# Patient Record
Sex: Female | Born: 1965
Health system: Southern US, Community
[De-identification: ages and names within clinical notes are randomized; demographics above are authoritative.]

## PROBLEM LIST (undated history)

## (undated) DIAGNOSIS — D649 Anemia, unspecified: Secondary | ICD-10-CM

## (undated) DIAGNOSIS — G4730 Sleep apnea, unspecified: Secondary | ICD-10-CM

## (undated) DIAGNOSIS — R51 Headache: Secondary | ICD-10-CM

## (undated) HISTORY — PX: ABDOMINAL HYSTERECTOMY: SHX81

## (undated) HISTORY — PX: APPENDECTOMY: SHX54

## (undated) HISTORY — PX: TUBAL LIGATION: SHX77

---

## 1999-07-10 ENCOUNTER — Other Ambulatory Visit: Admission: RE | Admit: 1999-07-10 | Discharge: 1999-07-10 | Payer: Self-pay | Admitting: *Deleted

## 1999-08-09 ENCOUNTER — Other Ambulatory Visit: Admission: RE | Admit: 1999-08-09 | Discharge: 1999-08-09 | Payer: Self-pay | Admitting: *Deleted

## 1999-10-29 ENCOUNTER — Other Ambulatory Visit: Admission: RE | Admit: 1999-10-29 | Discharge: 1999-10-29 | Payer: Self-pay | Admitting: *Deleted

## 2000-04-04 ENCOUNTER — Other Ambulatory Visit: Admission: RE | Admit: 2000-04-04 | Discharge: 2000-04-04 | Payer: Self-pay | Admitting: *Deleted

## 2000-05-25 ENCOUNTER — Other Ambulatory Visit: Admission: RE | Admit: 2000-05-25 | Discharge: 2000-05-25 | Payer: Self-pay | Admitting: *Deleted

## 2000-12-08 ENCOUNTER — Other Ambulatory Visit: Admission: RE | Admit: 2000-12-08 | Discharge: 2000-12-08 | Payer: Self-pay | Admitting: *Deleted

## 2001-04-27 ENCOUNTER — Other Ambulatory Visit: Admission: RE | Admit: 2001-04-27 | Discharge: 2001-04-27 | Payer: Self-pay | Admitting: *Deleted

## 2002-06-19 ENCOUNTER — Other Ambulatory Visit: Admission: RE | Admit: 2002-06-19 | Discharge: 2002-06-19 | Payer: Self-pay | Admitting: *Deleted

## 2003-07-02 ENCOUNTER — Other Ambulatory Visit: Admission: RE | Admit: 2003-07-02 | Discharge: 2003-07-02 | Payer: Self-pay | Admitting: *Deleted

## 2012-02-22 ENCOUNTER — Encounter (HOSPITAL_BASED_OUTPATIENT_CLINIC_OR_DEPARTMENT_OTHER): Payer: Self-pay | Admitting: *Deleted

## 2012-02-22 ENCOUNTER — Emergency Department (HOSPITAL_BASED_OUTPATIENT_CLINIC_OR_DEPARTMENT_OTHER)
Admission: EM | Admit: 2012-02-22 | Discharge: 2012-02-23 | Disposition: A | Discharge status: Home / Self Care | Payer: Managed Care, Other (non HMO) | Attending: Emergency Medicine | Admitting: Emergency Medicine

## 2012-02-22 ENCOUNTER — Emergency Department (INDEPENDENT_AMBULATORY_CARE_PROVIDER_SITE_OTHER): Payer: Managed Care, Other (non HMO)

## 2012-02-22 DIAGNOSIS — M79609 Pain in unspecified limb: Secondary | ICD-10-CM

## 2012-02-22 DIAGNOSIS — R071 Chest pain on breathing: Secondary | ICD-10-CM | POA: Insufficient documentation

## 2012-02-22 DIAGNOSIS — R079 Chest pain, unspecified: Secondary | ICD-10-CM

## 2012-02-22 DIAGNOSIS — E669 Obesity, unspecified: Secondary | ICD-10-CM | POA: Insufficient documentation

## 2012-02-22 DIAGNOSIS — Z79899 Other long term (current) drug therapy: Secondary | ICD-10-CM | POA: Insufficient documentation

## 2012-02-22 DIAGNOSIS — R0602 Shortness of breath: Secondary | ICD-10-CM | POA: Insufficient documentation

## 2012-02-22 DIAGNOSIS — R0789 Other chest pain: Secondary | ICD-10-CM

## 2012-02-22 LAB — BASIC METABOLIC PANEL
BUN: 12 mg/dL (ref 6–23)
Chloride: 102 mEq/L (ref 96–112)
GFR calc Af Amer: >90 mL/min (ref >90)
GFR calc non Af Amer: >90 mL/min (ref >90)
Potassium: 3.7 mEq/L (ref 3.5–5.1)

## 2012-02-22 LAB — DIFFERENTIAL
Basophils Absolute: 0 10*3/uL (ref 0.0–0.1)
Basophils Relative: 0 % (ref 0–1)
Eosinophils Absolute: 0.1 10*3/uL (ref 0.0–0.7)
Neutro Abs: 6.5 10*3/uL (ref 1.7–7.7)
Neutrophils Relative %: 60 % (ref 43–77)

## 2012-02-22 LAB — CBC
Hemoglobin: 12.2 g/dL (ref 12.0–15.0)
MCH: 28.6 pg (ref 26.0–34.0)
MCHC: 33.9 g/dL (ref 30.0–36.0)
Platelets: 250 10*3/uL (ref 150–400)
RDW: 13.5 % (ref 11.5–15.5)

## 2012-02-22 LAB — TROPONIN I: Troponin I: <0.3 ng/mL (ref <0.30)

## 2012-02-22 MED ORDER — KETOROLAC TROMETHAMINE 30 MG/ML IJ SOLN
30.0000 mg | Freq: Once | INTRAMUSCULAR | Status: AC
  Administered 2012-02-22: 30 mg via INTRAVENOUS
  Filled 2012-02-22: qty 1

## 2012-02-22 NOTE — ED Notes (Signed)
Left shoulder pain x 1 week. Today at work had pain in her chest. Took aleve x 2 with no relief.

## 2012-02-23 ENCOUNTER — Emergency Department (HOSPITAL_BASED_OUTPATIENT_CLINIC_OR_DEPARTMENT_OTHER): Payer: Managed Care, Other (non HMO)

## 2012-02-23 LAB — D-DIMER, QUANTITATIVE: D-Dimer, Quant: 1.03 ug/mL-FEU — ABNORMAL HIGH (ref 0.00–0.48)

## 2012-02-23 MED ORDER — IOHEXOL 350 MG/ML SOLN
80.0000 mL | Freq: Once | INTRAVENOUS | Status: AC | PRN
  Administered 2012-02-23: 80 mL via INTRAVENOUS

## 2012-02-23 MED ORDER — HYDROCODONE-ACETAMINOPHEN 5-500 MG PO TABS: 1.0000 | ORAL_TABLET | Freq: Four times a day (QID) | ORAL | Status: AC | PRN

## 2012-02-23 NOTE — Discharge Instructions (Signed)
Chest Wall Pain Chest wall pain is pain in or around the bones and muscles of your chest. It may take up to 6 weeks to get better. It may take longer if you must stay physically active in your work and activities.  CAUSES  Chest wall pain may happen on its own. However, it may be caused by:  A viral illness like the flu.   Injury.   Coughing.   Exercise.   Arthritis.   Fibromyalgia.   Shingles.  HOME CARE INSTRUCTIONS   Avoid overtiring physical activity. Try not to strain or perform activities that cause pain. This includes any activities using your chest or your abdominal and side muscles, especially if heavy weights are used.   Put ice on the sore area.   Put ice in a plastic bag.   Place a towel between your skin and the bag.   Leave the ice on for 15 to 20 minutes per hour while awake for the first 2 days.   Only take over-the-counter or prescription medicines for pain, discomfort, or fever as directed by your caregiver.  SEEK IMMEDIATE MEDICAL CARE IF:   Your pain increases, or you are very uncomfortable.   You have a fever.   Your chest pain becomes worse.   You have new, unexplained symptoms.   You have nausea or vomiting.   You feel sweaty or lightheaded.   You have a cough with phlegm (sputum), or you cough up blood.  MAKE SURE YOU:   Understand these instructions.   Will watch your condition.   Will get help right away if you are not doing well or get worse.  Document Released: 09/27/2005 Document Revised: 09/16/2011 Document Reviewed: 05/24/2011 Exit[employer] Patient Information 2012 [employer]. 

## 2012-02-23 NOTE — ED Provider Notes (Signed)
History     CSN: [id number]  Arrival date & time 02/22/12  2222   First MD Initiated Contact with Patient 02/22/12 2322      Chief Complaint  Patient presents with  . Chest Pain    (Consider location/radiation/quality/duration/timing/severity/associated sxs/prior treatment) Patient is a 46 y.o. female presenting with chest pain. The history is provided by the patient. No language interpreter was used.  Chest Pain The chest pain began 6 - 12 hours ago. Duration of episode(s) is 12 hours. Chest pain occurs constantly. The chest pain is unchanged. Associated with: nothing. At its most intense, the pain is at 9/10. The pain is currently at 9/10. The severity of the pain is severe. The quality of the pain is described as sharp. Radiates to: acutally starts in the arm goes up and that pain pain has been present with movement of the LUE x 5 days constantly. Chest pain is worsened by certain positions. Pertinent negatives for primary symptoms include no syncope, no shortness of breath, no cough, no palpitations, no nausea and no vomiting.  Pertinent negatives for associated symptoms include no claudication, no diaphoresis, no lower extremity edema and no weakness. She tried nothing for the symptoms. Risk factors include obesity.  Pertinent negatives for past medical history include no Marfan's syndrome.  Pertinent negatives for family medical history include: no Marfan's syndrome in family.  Procedure history is negative for cardiac catheterization.   Pain has been in the left arm for days and is worse with movement and palpation of the left upper extremity.  No long car trips or plane trips no pain or swelling in the lower extremities  History reviewed. No pertinent past medical history.  Past Surgical History  Procedure Date  . Appendectomy   . Tubal ligation     No family history on file.  History  Substance Use Topics  . Smoking status: Never Smoker   . Smokeless tobacco: Not on  file  . Alcohol Use: No    OB History    Grav Para Term Preterm Abortions TAB SAB Ect Mult Living                  Review of Systems  Constitutional: Negative for diaphoresis.  Respiratory: Negative for cough and shortness of breath.   Cardiovascular: Positive for chest pain. Negative for palpitations, claudication, leg swelling and syncope.  Gastrointestinal: Negative for nausea and vomiting.  Neurological: Negative for weakness.  All other systems reviewed and are negative.    Allergies  Peanut-containing drug products  Home Medications   Current Outpatient Rx  Name Route Sig Dispense Refill  . FOLIC ACID 1 MG PO TABS Oral Take 1 mg by mouth daily.    Marland Kitchen NAPROXEN SODIUM 220 MG PO TABS Oral Take 440 mg by mouth once as needed. For pain    . HYDROCODONE-ACETAMINOPHEN 5-500 MG PO TABS Oral Take 1 tablet by mouth every 6 (six) hours as needed for pain. 10 tablet 0    BP 132/89  Pulse 88  Temp(Src) 98.5 F (36.9 C) (Oral)  Resp 20  SpO2 99%  LMP 02/09/2012  Physical Exam  Constitutional: She is oriented to person, place, and time. She appears well-developed and well-nourished.  HENT:  Head: Normocephalic and atraumatic.  Mouth/Throat: Oropharynx is clear and moist.  Eyes: Conjunctivae are normal. Pupils are equal, round, and reactive to light.  Neck: Normal range of motion. Neck supple.  Cardiovascular: Normal rate and regular rhythm.   Pulmonary/Chest: Effort normal  and breath sounds normal. She has no wheezes. She has no rales. She exhibits tenderness.  Abdominal: Soft. Bowel sounds are normal. There is no tenderness. There is no rebound and no guarding.  Musculoskeletal: Normal range of motion.  Neurological: She is alert and oriented to person, place, and time.  Skin: Skin is warm and dry.  Psychiatric: She has a normal mood and affect.    ED Course  Procedures (including critical care time)  Labs Reviewed  CBC - Abnormal; Notable for the following:    WBC  10.8 (*)    All other components within normal limits  D-DIMER, QUANTITATIVE - Abnormal; Notable for the following:    D-Dimer, Quant 1.03 (*)    All other components within normal limits  DIFFERENTIAL  BASIC METABOLIC PANEL  TROPONIN I  PREGNANCY, URINE   Dg Chest 2 View  02/22/2012  *RADIOLOGY REPORT*  Clinical Data: Left chest and arm pain.  CHEST - 2 VIEW  Comparison: None.  Findings: Mildly low lung volumes noted.  Cardiac and mediastinal contours appear normal.  The lungs appear clear.  No pleural effusion is identified.  IMPRESSION:  No significant abnormality identified.  Original Report Authenticated By: Dellia Cloud, M.D.   Ct Angio Chest W/cm &/or Wo Cm  02/23/2012  *RADIOLOGY REPORT*  Clinical Data: Chest pain and shortness of breath with inspiration. Elevated D-dimer.  CT ANGIOGRAPHY CHEST  Technique:  Multidetector CT imaging of the chest using the standard protocol during bolus administration of intravenous contrast. Multiplanar reconstructed images including MIPs were obtained and reviewed to evaluate the vascular anatomy.  Contrast: 80mL OMNIPAQUE IOHEXOL 350 MG/ML SOLN  Comparison: None.  Findings: Technically adequate study with moderately good opacification of the central and segmental pulmonary arteries.  No focal filling defects.  No evidence of significant pulmonary embolus.  Normal heart size.  Normal caliber thoracic aorta.  No significant lymphadenopathy in the chest.  Esophagus is decompressed.  No pleural effusions.  The mild dependent changes in the lung bases. No significant airspace consolidation or interstitial changes. Airways appear patent.  No pneumothorax.  Incidental note of two low attenuation lesions in the posterior right lobe of the liver, measuring 19 mm and 22 mm diameter, respectively.  These are not well characterized on this study due to technique.  They may represent cysts, hemangiomas, or focal mass lesions such as metastasis.  Consider ultrasound  or abdominal CT for further evaluation if there is clinical suspicion of metastatic disease.  Degenerative changes in the thoracic spine.  IMPRESSION: No evidence of significant pulmonary embolus.  Two low attenuation lesions demonstrated in the right lobe of the liver which are not well characterized due to the technique of this study.  Consider ultrasound or abdominal CT if there is clinical suspicion of metastatic disease.  Original Report Authenticated By: Marlon Pel, M.D.     1. Chest wall pain      Date: 02/23/2012  Rate: 76  Rhythm: normal sinus rhythm  QRS Axis: normal  Intervals: normal  ST/T Wave abnormalities: normal  Conduction Disutrbances: none  Narrative Interpretation: unremarkable     MDM  [address] d/w Ysun of [employer], a partner of patient's PMD.  He will schedule patient for outpatient CT of the abdomen and pelvis to further categorize liver lesions of CTA  Patient informed of liver lesions on CTA and need to follow up closely for CT of abdomen and pelvis.  Patient verbalizes understanding and will contact [facility] in  am for outpatient CT scan of abdomen and pelvis and follow up appointment  Return immediately worsening chest pain shortness or breath or any concerns.          Iolanda Folson Smitty Cords, MD 02/23/12 [phone]

## 2013-07-19 ENCOUNTER — Encounter (HOSPITAL_COMMUNITY): Payer: Self-pay | Admitting: Pharmacist

## 2013-07-20 ENCOUNTER — Other Ambulatory Visit: Payer: Self-pay | Admitting: Obstetrics and Gynecology

## 2013-07-20 NOTE — Patient Instructions (Addendum)
Your procedure is scheduled on: 07/30/2013 Monday  Enter through the Main Entrance of [facility] at: 1130AM  Pick up the phone at the desk and dial [phone].  Call this number if you have problems the morning of surgery: [phone].  Remember: Do NOT eat food: AFTER MIDNIGHT 07/29/2013 Sunday  Do NOT drink clear liquids after: AFTER 0900AM 07/30/2013  Monday-day of surgery  Take these medicines the morning of surgery with a SIP OF WATER:  None  Do NOT wear jewelry (body piercing), make-up, or nail polish. Do NOT wear lotions, powders, or perfumes.  You may wear deoderant. Do NOT shave for 48 hours prior to surgery. Do NOT bring valuables to the hospital. Contacts, dentures, or bridgework may not be worn into surgery. Leave suitcase in car.  After surgery it may be brought to your room.  For patients admitted to the hospital, checkout time is 11:00 AM the day of discharge. Home with daughter Gabriel Rung [phone].

## 2013-07-23 ENCOUNTER — Encounter (HOSPITAL_COMMUNITY)
Admission: RE | Admit: 2013-07-23 | Discharge: 2013-07-23 | Disposition: A | Discharge status: Home / Self Care | Payer: Managed Care, Other (non HMO) | Source: Ambulatory Visit | Attending: Obstetrics and Gynecology | Admitting: Obstetrics and Gynecology

## 2013-07-23 ENCOUNTER — Encounter (HOSPITAL_COMMUNITY): Payer: Self-pay

## 2013-07-23 DIAGNOSIS — Z01812 Encounter for preprocedural laboratory examination: Secondary | ICD-10-CM | POA: Insufficient documentation

## 2013-07-23 HISTORY — DX: Anemia, unspecified: D64.9

## 2013-07-23 HISTORY — DX: Sleep apnea, unspecified: G47.30

## 2013-07-23 HISTORY — DX: Headache: R51

## 2013-07-23 LAB — CBC
MCH: 28 pg (ref 26.0–34.0)
MCV: 85.1 fL (ref 78.0–100.0)
Platelets: 235 10*3/uL (ref 150–400)
RBC: 4.04 MIL/uL (ref 3.87–5.11)
RDW: 13.4 % (ref 11.5–15.5)
WBC: 9.3 10*3/uL (ref 4.0–10.5)

## 2013-07-23 LAB — BASIC METABOLIC PANEL
CO2: 27 mEq/L (ref 19–32)
Calcium: 9.1 mg/dL (ref 8.4–10.5)
Creatinine, Ser: 0.68 mg/dL (ref 0.50–1.10)
GFR calc non Af Amer: >90 mL/min (ref >90)
Glucose, Bld: 97 mg/dL (ref 70–99)
Sodium: 136 mEq/L (ref 135–145)

## 2013-07-29 MED ORDER — DEXTROSE 5 % IV SOLN
3.0000 g | INTRAVENOUS | Status: AC
  Administered 2013-07-30: 3 g via INTRAVENOUS
  Filled 2013-07-29: qty 3000

## 2013-07-30 ENCOUNTER — Encounter (HOSPITAL_COMMUNITY): Payer: Managed Care, Other (non HMO) | Admitting: Anesthesiology

## 2013-07-30 ENCOUNTER — Encounter (HOSPITAL_COMMUNITY)
Admission: RE | Disposition: A | Discharge status: Home / Self Care | Payer: Self-pay | Source: Ambulatory Visit | Attending: Obstetrics and Gynecology

## 2013-07-30 ENCOUNTER — Ambulatory Visit (HOSPITAL_COMMUNITY): Payer: Managed Care, Other (non HMO) | Admitting: Anesthesiology

## 2013-07-30 ENCOUNTER — Ambulatory Visit (HOSPITAL_COMMUNITY)
Admission: RE | Admit: 2013-07-30 | Discharge: 2013-07-31 | Disposition: A | Discharge status: Home / Self Care | Payer: Managed Care, Other (non HMO) | Source: Ambulatory Visit | Attending: Obstetrics and Gynecology | Admitting: Obstetrics and Gynecology

## 2013-07-30 DIAGNOSIS — M25519 Pain in unspecified shoulder: Secondary | ICD-10-CM | POA: Insufficient documentation

## 2013-07-30 DIAGNOSIS — Z90710 Acquired absence of both cervix and uterus: Secondary | ICD-10-CM

## 2013-07-30 DIAGNOSIS — D251 Intramural leiomyoma of uterus: Secondary | ICD-10-CM | POA: Insufficient documentation

## 2013-07-30 DIAGNOSIS — D649 Anemia, unspecified: Secondary | ICD-10-CM | POA: Insufficient documentation

## 2013-07-30 DIAGNOSIS — N920 Excessive and frequent menstruation with regular cycle: Secondary | ICD-10-CM | POA: Insufficient documentation

## 2013-07-30 DIAGNOSIS — D250 Submucous leiomyoma of uterus: Secondary | ICD-10-CM | POA: Insufficient documentation

## 2013-07-30 HISTORY — PX: ROBOTIC ASSISTED TOTAL HYSTERECTOMY: SHX6085

## 2013-07-30 HISTORY — PX: BILATERAL SALPINGECTOMY: SHX5743

## 2013-07-30 HISTORY — PX: CYSTOSCOPY: SHX5120

## 2013-07-30 LAB — TYPE AND SCREEN: Antibody Screen: NEGATIVE

## 2013-07-30 LAB — ABO/RH: ABO/RH(D): A POS

## 2013-07-30 SURGERY — ROBOTIC ASSISTED TOTAL HYSTERECTOMY
Anesthesia: General | Site: Bladder | Wound class: Clean Contaminated

## 2013-07-30 MED ORDER — BUPIVACAINE HCL (PF) 0.25 % IJ SOLN
INTRAMUSCULAR | Status: DC | PRN
  Administered 2013-07-30: 10 mL

## 2013-07-30 MED ORDER — GLYCOPYRROLATE 0.2 MG/ML IJ SOLN
INTRAMUSCULAR | Status: AC
  Filled 2013-07-30: qty 4

## 2013-07-30 MED ORDER — ROCURONIUM BROMIDE 50 MG/5ML IV SOLN
INTRAVENOUS | Status: AC
  Filled 2013-07-30: qty 2

## 2013-07-30 MED ORDER — NEOSTIGMINE METHYLSULFATE 1 MG/ML IJ SOLN
INTRAMUSCULAR | Status: DC | PRN
  Administered 2013-07-30: 5 mg via INTRAVENOUS

## 2013-07-30 MED ORDER — LIDOCAINE HCL (CARDIAC) 20 MG/ML IV SOLN
INTRAVENOUS | Status: AC
  Filled 2013-07-30: qty 5

## 2013-07-30 MED ORDER — ONDANSETRON HCL 4 MG/2ML IJ SOLN
INTRAMUSCULAR | Status: DC | PRN
  Administered 2013-07-30: 4 mg via INTRAVENOUS

## 2013-07-30 MED ORDER — MEPERIDINE HCL 25 MG/ML IJ SOLN: 6.2500 mg | INTRAMUSCULAR | Status: DC | PRN

## 2013-07-30 MED ORDER — NEOSTIGMINE METHYLSULFATE 1 MG/ML IJ SOLN
INTRAMUSCULAR | Status: AC
  Filled 2013-07-30: qty 1

## 2013-07-30 MED ORDER — MIDAZOLAM HCL 2 MG/2ML IJ SOLN
INTRAMUSCULAR | Status: DC | PRN
  Administered 2013-07-30: 2 mg via INTRAVENOUS

## 2013-07-30 MED ORDER — ONDANSETRON HCL 4 MG/2ML IJ SOLN: 4.0000 mg | Freq: Four times a day (QID) | INTRAMUSCULAR | Status: DC | PRN

## 2013-07-30 MED ORDER — ACETAMINOPHEN 10 MG/ML IV SOLN
1000.0000 mg | Freq: Once | INTRAVENOUS | Status: AC
  Administered 2013-07-30: 1000 mg via INTRAVENOUS
  Filled 2013-07-30: qty 100

## 2013-07-30 MED ORDER — KETOROLAC TROMETHAMINE 30 MG/ML IJ SOLN
30.0000 mg | Freq: Four times a day (QID) | INTRAMUSCULAR | Status: DC
  Administered 2013-07-30: 30 mg via INTRAVENOUS
  Filled 2013-07-30: qty 1

## 2013-07-30 MED ORDER — FENTANYL CITRATE 0.05 MG/ML IJ SOLN
25.0000 ug | INTRAMUSCULAR | Status: DC | PRN
  Administered 2013-07-30 (×2): 50 ug via INTRAVENOUS

## 2013-07-30 MED ORDER — PROPOFOL 10 MG/ML IV EMUL
INTRAVENOUS | Status: AC
  Filled 2013-07-30: qty 20

## 2013-07-30 MED ORDER — PHENYLEPHRINE HCL 10 MG/ML IJ SOLN
10.0000 mg | INTRAVENOUS | Status: DC | PRN
  Administered 2013-07-30: 25 ug/min via INTRAVENOUS

## 2013-07-30 MED ORDER — ROCURONIUM BROMIDE 50 MG/5ML IV SOLN
INTRAVENOUS | Status: AC
  Filled 2013-07-30: qty 1

## 2013-07-30 MED ORDER — KETOROLAC TROMETHAMINE 30 MG/ML IJ SOLN: 30.0000 mg | Freq: Four times a day (QID) | INTRAMUSCULAR | Status: DC

## 2013-07-30 MED ORDER — LACTATED RINGERS IR SOLN
Status: DC | PRN
  Administered 2013-07-30: 3000 mL

## 2013-07-30 MED ORDER — FENTANYL CITRATE 0.05 MG/ML IJ SOLN
INTRAMUSCULAR | Status: AC
  Administered 2013-07-30: 50 ug via INTRAVENOUS
  Filled 2013-07-30: qty 2

## 2013-07-30 MED ORDER — ROCURONIUM BROMIDE 100 MG/10ML IV SOLN
INTRAVENOUS | Status: DC | PRN
  Administered 2013-07-30 (×2): 10 mg via INTRAVENOUS
  Administered 2013-07-30 (×2): 5 mg via INTRAVENOUS
  Administered 2013-07-30 (×2): 20 mg via INTRAVENOUS
  Administered 2013-07-30: 50 mg via INTRAVENOUS
  Administered 2013-07-30: 10 mg via INTRAVENOUS

## 2013-07-30 MED ORDER — KETOROLAC TROMETHAMINE 30 MG/ML IJ SOLN
INTRAMUSCULAR | Status: AC
  Administered 2013-07-30: 30 mg via INTRAVENOUS
  Filled 2013-07-30: qty 1

## 2013-07-30 MED ORDER — ONDANSETRON HCL 4 MG/2ML IJ SOLN
INTRAMUSCULAR | Status: AC
  Filled 2013-07-30: qty 2

## 2013-07-30 MED ORDER — METOCLOPRAMIDE HCL 5 MG/ML IJ SOLN: 10.0000 mg | Freq: Once | INTRAMUSCULAR | Status: DC | PRN

## 2013-07-30 MED ORDER — FENTANYL CITRATE 0.05 MG/ML IJ SOLN
INTRAMUSCULAR | Status: DC | PRN
  Administered 2013-07-30: 50 ug via INTRAVENOUS
  Administered 2013-07-30: 100 ug via INTRAVENOUS
  Administered 2013-07-30 (×2): 50 ug via INTRAVENOUS

## 2013-07-30 MED ORDER — ONDANSETRON HCL 4 MG PO TABS: 4.0000 mg | ORAL_TABLET | Freq: Four times a day (QID) | ORAL | Status: DC | PRN

## 2013-07-30 MED ORDER — ZOLPIDEM TARTRATE 5 MG PO TABS: 5.0000 mg | ORAL_TABLET | Freq: Every evening | ORAL | Status: DC | PRN

## 2013-07-30 MED ORDER — LACTATED RINGERS IV SOLN
INTRAVENOUS | Status: DC
  Administered 2013-07-30 (×2): via INTRAVENOUS

## 2013-07-30 MED ORDER — HYDROMORPHONE HCL PF 1 MG/ML IJ SOLN
0.2000 mg | INTRAMUSCULAR | Status: DC | PRN
  Administered 2013-07-31: 0.6 mg via INTRAVENOUS
  Filled 2013-07-30: qty 1

## 2013-07-30 MED ORDER — MENTHOL 3 MG MT LOZG: 1.0000 | LOZENGE | OROMUCOSAL | Status: DC | PRN

## 2013-07-30 MED ORDER — LACTATED RINGERS IV SOLN
INTRAVENOUS | Status: DC
  Administered 2013-07-30 (×3): via INTRAVENOUS

## 2013-07-30 MED ORDER — PANTOPRAZOLE SODIUM 40 MG PO TBEC
40.0000 mg | DELAYED_RELEASE_TABLET | Freq: Every day | ORAL | Status: DC
  Administered 2013-07-31: 40 mg via ORAL
  Filled 2013-07-30 (×2): qty 1

## 2013-07-30 MED ORDER — INDIGOTINDISULFONATE SODIUM 8 MG/ML IJ SOLN
INTRAMUSCULAR | Status: AC
  Filled 2013-07-30: qty 5

## 2013-07-30 MED ORDER — DEXAMETHASONE SODIUM PHOSPHATE 10 MG/ML IJ SOLN
INTRAMUSCULAR | Status: DC | PRN
  Administered 2013-07-30: 10 mg via INTRAVENOUS

## 2013-07-30 MED ORDER — LIDOCAINE HCL (CARDIAC) 20 MG/ML IV SOLN
INTRAVENOUS | Status: DC | PRN
  Administered 2013-07-30: 100 mg via INTRAVENOUS

## 2013-07-30 MED ORDER — DEXTROSE IN LACTATED RINGERS 5 % IV SOLN: INTRAVENOUS | Status: DC

## 2013-07-30 MED ORDER — FENTANYL CITRATE 0.05 MG/ML IJ SOLN
INTRAMUSCULAR | Status: AC
  Filled 2013-07-30: qty 5

## 2013-07-30 MED ORDER — PROPOFOL 10 MG/ML IV BOLUS
INTRAVENOUS | Status: DC | PRN
  Administered 2013-07-30 (×2): 50 mg via INTRAVENOUS
  Administered 2013-07-30: 100 mg via INTRAVENOUS
  Administered 2013-07-30: 50 mg via INTRAVENOUS
  Administered 2013-07-30: 40 mg via INTRAVENOUS
  Administered 2013-07-30 (×2): 50 mg via INTRAVENOUS
  Administered 2013-07-30: 200 mg via INTRAVENOUS

## 2013-07-30 MED ORDER — STERILE WATER FOR IRRIGATION IR SOLN
Status: DC | PRN
  Administered 2013-07-30: 1000 mL

## 2013-07-30 MED ORDER — OXYCODONE-ACETAMINOPHEN 5-325 MG PO TABS
1.0000 | ORAL_TABLET | ORAL | Status: DC | PRN
  Administered 2013-07-31: 2 via ORAL
  Filled 2013-07-30: qty 2

## 2013-07-30 MED ORDER — SODIUM CHLORIDE 0.9 % IJ SOLN
INTRAMUSCULAR | Status: AC
  Filled 2013-07-30: qty 10

## 2013-07-30 MED ORDER — DEXTROSE IN LACTATED RINGERS 5 % IV SOLN
INTRAVENOUS | Status: DC
  Administered 2013-07-30: 22:00:00 via INTRAVENOUS

## 2013-07-30 MED ORDER — MIDAZOLAM HCL 2 MG/2ML IJ SOLN
INTRAMUSCULAR | Status: AC
  Filled 2013-07-30: qty 2

## 2013-07-30 MED ORDER — IBUPROFEN 800 MG PO TABS
800.0000 mg | ORAL_TABLET | Freq: Three times a day (TID) | ORAL | Status: DC | PRN
  Administered 2013-07-31: 800 mg via ORAL
  Filled 2013-07-30: qty 1

## 2013-07-30 MED ORDER — BUPIVACAINE HCL (PF) 0.25 % IJ SOLN
INTRAMUSCULAR | Status: AC
  Filled 2013-07-30: qty 30

## 2013-07-30 MED ORDER — KETOROLAC TROMETHAMINE 30 MG/ML IJ SOLN
15.0000 mg | Freq: Once | INTRAMUSCULAR | Status: AC | PRN
  Administered 2013-07-30: 30 mg via INTRAVENOUS

## 2013-07-30 MED ORDER — INDIGOTINDISULFONATE SODIUM 8 MG/ML IJ SOLN
INTRAMUSCULAR | Status: DC | PRN
  Administered 2013-07-30: 4 mL via INTRAVENOUS

## 2013-07-30 MED ORDER — GLYCOPYRROLATE 0.2 MG/ML IJ SOLN
INTRAMUSCULAR | Status: DC | PRN
  Administered 2013-07-30: .8 mg via INTRAVENOUS

## 2013-07-30 MED ORDER — HYDROMORPHONE HCL PF 1 MG/ML IJ SOLN
INTRAMUSCULAR | Status: DC | PRN
  Administered 2013-07-30: 0.5 mg via INTRAVENOUS
  Administered 2013-07-30: .5 mg via INTRAVENOUS

## 2013-07-30 MED ORDER — DEXAMETHASONE SODIUM PHOSPHATE 10 MG/ML IJ SOLN
INTRAMUSCULAR | Status: AC
  Filled 2013-07-30: qty 1

## 2013-07-30 SURGICAL SUPPLY — 67 items
BAG URINE DRAINAGE (UROLOGICAL SUPPLIES) ×4 IMPLANT
BARRIER ADHS 3X4 INTERCEED (GAUZE/BANDAGES/DRESSINGS) ×4 IMPLANT
CATH FOLEY 3WAY  5CC 16FR (CATHETERS) ×1
CATH FOLEY 3WAY 30CC 16FR (CATHETERS) ×4 IMPLANT
CATH FOLEY 3WAY 5CC 16FR (CATHETERS) ×3 IMPLANT
CHLORAPREP W/TINT 26ML (MISCELLANEOUS) ×4 IMPLANT
CLOTH BEACON ORANGE TIMEOUT ST (SAFETY) ×4 IMPLANT
CONT PATH 16OZ SNAP LID 3702 (MISCELLANEOUS) ×4 IMPLANT
COVER MAYO STAND STRL (DRAPES) ×4 IMPLANT
COVER TABLE BACK 60X90 (DRAPES) ×8 IMPLANT
COVER TIP SHEARS 8 DVNC (MISCELLANEOUS) ×3 IMPLANT
COVER TIP SHEARS 8MM DA VINCI (MISCELLANEOUS) ×1
DECANTER SPIKE VIAL GLASS SM (MISCELLANEOUS) ×4 IMPLANT
DERMABOND ADVANCED (GAUZE/BANDAGES/DRESSINGS) ×1
DERMABOND ADVANCED .7 DNX12 (GAUZE/BANDAGES/DRESSINGS) ×3 IMPLANT
DEVICE TROCAR PUNCTURE CLOSURE (ENDOMECHANICALS) IMPLANT
DRAPE HUG U DISPOSABLE (DRAPE) ×4 IMPLANT
DRAPE LG THREE QUARTER DISP (DRAPES) ×8 IMPLANT
DRAPE WARM FLUID 44X44 (DRAPE) ×4 IMPLANT
ELECT REM PT RETURN 9FT ADLT (ELECTROSURGICAL) ×4
ELECTRODE REM PT RTRN 9FT ADLT (ELECTROSURGICAL) ×3 IMPLANT
EVACUATOR SMOKE 8.L (FILTER) ×4 IMPLANT
GAUZE VASELINE 3X9 (GAUZE/BANDAGES/DRESSINGS) IMPLANT
GLOVE BIOGEL PI IND STRL 7.0 (GLOVE) ×6 IMPLANT
GLOVE BIOGEL PI INDICATOR 7.0 (GLOVE) ×2
GLOVE ECLIPSE 7.0 STRL STRAW (GLOVE) ×4 IMPLANT
GOWN STRL REIN XL XLG (GOWN DISPOSABLE) ×24 IMPLANT
KIT ACCESSORY DA VINCI DISP (KITS) ×1
KIT ACCESSORY DVNC DISP (KITS) ×3 IMPLANT
LEGGING LITHOTOMY PAIR STRL (DRAPES) ×4 IMPLANT
NEEDLE INSUFFLATION 14GA 150MM (NEEDLE) ×4 IMPLANT
OCCLUDER COLPOPNEUMO (BALLOONS) ×4 IMPLANT
PACK LAVH (CUSTOM PROCEDURE TRAY) ×4 IMPLANT
PAD PREP 24X48 CUFFED NSTRL (MISCELLANEOUS) ×8 IMPLANT
PLUG CATH AND CAP STER (CATHETERS) ×4 IMPLANT
PROTECTOR NERVE ULNAR (MISCELLANEOUS) ×8 IMPLANT
SCISSORS LAP 5X35 DISP (ENDOMECHANICALS) IMPLANT
SET CYSTO W/LG BORE CLAMP LF (SET/KITS/TRAYS/PACK) ×4 IMPLANT
SET IRRIG TUBING LAPAROSCOPIC (IRRIGATION / IRRIGATOR) ×4 IMPLANT
SOLUTION ELECTROLUBE (MISCELLANEOUS) ×4 IMPLANT
SPONGE LAP 4X18 X RAY DECT (DISPOSABLE) ×4 IMPLANT
SUT VIC AB 0 CT1 27 (SUTURE) ×3
SUT VIC AB 0 CT1 27XBRD ANBCTR (SUTURE) ×6 IMPLANT
SUT VIC AB 0 CT1 27XBRD ANTBC (SUTURE) ×3 IMPLANT
SUT VIC AB 0 CT1 36 (SUTURE) ×8 IMPLANT
SUT VICRYL 0 27 CT2 27 ABS (SUTURE) ×4 IMPLANT
SUT VICRYL 0 UR6 27IN ABS (SUTURE) ×4 IMPLANT
SUT VLOC 180 0 9IN  GS21 (SUTURE) ×2
SUT VLOC 180 0 9IN GS21 (SUTURE) ×6 IMPLANT
SYR 50ML LL SCALE MARK (SYRINGE) ×4 IMPLANT
SYRINGE 10CC LL (SYRINGE) ×4 IMPLANT
TIP RUMI ORANGE 6.7MMX12CM (TIP) IMPLANT
TIP UTERINE 5.1X6CM LAV DISP (MISCELLANEOUS) IMPLANT
TIP UTERINE 6.7X10CM GRN DISP (MISCELLANEOUS) ×4 IMPLANT
TIP UTERINE 6.7X6CM WHT DISP (MISCELLANEOUS) IMPLANT
TIP UTERINE 6.7X8CM BLUE DISP (MISCELLANEOUS) IMPLANT
TOWEL OR 17X24 6PK STRL BLUE (TOWEL DISPOSABLE) ×12 IMPLANT
TROCAR 12M 150ML BLUNT (TROCAR) ×4 IMPLANT
TROCAR 5M 150ML BLDLS (TROCAR) ×4 IMPLANT
TROCAR DISP BLADELESS 8 DVNC (TROCAR) ×3 IMPLANT
TROCAR DISP BLADELESS 8MM (TROCAR) ×1
TROCAR XCEL 12X100 BLDLESS (ENDOMECHANICALS) ×4 IMPLANT
TROCAR XCEL NON-BLD 5MMX100MML (ENDOMECHANICALS) ×8 IMPLANT
TROCAR Z-THREAD 12X150 (TROCAR) ×4 IMPLANT
TUBING FILTER THERMOFLATOR (ELECTROSURGICAL) ×4 IMPLANT
WARMER LAPAROSCOPE (MISCELLANEOUS) ×4 IMPLANT
WATER STERILE IRR 1000ML POUR (IV SOLUTION) ×12 IMPLANT

## 2013-07-30 NOTE — Anesthesia Preprocedure Evaluation (Signed)
Anesthesia Evaluation  Patient identified by MRN, date of birth, ID band Patient awake    Reviewed: Allergy & Precautions, H&P , Patient's Chart, lab work & pertinent test results, reviewed documented beta blocker date and time   History of Anesthesia Complications Negative for: history of anesthetic complications  Airway       Dental no notable dental hx.    Pulmonary sleep apnea ,  breath sounds clear to auscultation  Pulmonary exam normal       Cardiovascular Exercise Tolerance: Good negative cardio ROS  Rhythm:regular Rate:Normal     Neuro/Psych negative neurological ROS  negative psych ROS   GI/Hepatic negative GI ROS, Neg liver ROS,   Endo/Other  Morbid obesity  Renal/GU negative Renal ROS  Female GU complaint     Musculoskeletal   Abdominal   Peds  Hematology  (+) anemia ,   Anesthesia Other Findings   Reproductive/Obstetrics negative OB ROS                           Anesthesia Physical Anesthesia Plan  ASA: III  Anesthesia Plan: General ETT   Post-op Pain Management:    Induction: Intravenous  Airway Management Planned: Oral ETT  Additional Equipment: Arterial line  Intra-op Plan:   Post-operative Plan:   Informed Consent: I have reviewed the patients History and Physical, chart, labs and discussed the procedure including the risks, benefits and alternatives for the proposed anesthesia with the patient or authorized representative who has indicated his/her understanding and acceptance.   Dental Advisory Given  Plan Discussed with: CRNA and Surgeon  Anesthesia Plan Comments: (Arterial line and 2 PIVs planned for Robotic TLH/BS with morbid obesity)        Anesthesia Quick Evaluation

## 2013-07-30 NOTE — Brief Op Note (Signed)
07/30/2013  6:14 PM  PATIENT:  Stacy Castro  47 y.o. female  PRE-OPERATIVE DIAGNOSIS:  Menorrhagia, Uterine Fibroids, Morbid Obesity    POST-OPERATIVE DIAGNOSIS:  menorrhagia,uterine fibroids, morbid obesity  PROCEDURE:  Procedure(s): ROBOTIC ASSISTED TOTAL HYSTERECTOMY (N/A) BILATERAL SALPINGECTOMY (Bilateral) CYSTOSCOPY  SURGEON:  Surgeon(s) and Role:    * Serita Kyle, MD - Primary  PHYSICIAN ASSISTANT:   ASSISTANTS: Marlinda Mike, CNM Raelyn Mora, CNM   ANESTHESIA:   general FINDINGS: large fibroid uterus, nl ovaries, surgical separated tubes, bruised urethra, patent ureters EBL:  Total I/O In: 2500 [I.V.:2500] Out: 900 [Urine:400; Blood:500]  BLOOD ADMINISTERED:none  DRAINS: none   LOCAL MEDICATIONS USED:  MARCAINE     SPECIMEN:  Source of Specimen:  uterus, cervix, tubes  DISPOSITION OF SPECIMEN:  PATHOLOGY  COUNTS:  YES  TOURNIQUET:  * No tourniquets in log *  DICTATION: .Other Dictation: Dictation Number [id number]  PLAN OF CARE: Admit for overnight observation  PATIENT DISPOSITION:  PACU - hemodynamically stable.   Delay start of Pharmacological VTE agent (>24hrs) due to surgical blood loss or risk of bleeding: no

## 2013-07-30 NOTE — Anesthesia Postprocedure Evaluation (Signed)
  Anesthesia Post-op Note  Patient: Stacy Castro  Procedure(s) Performed: Procedure(s): ROBOTIC ASSISTED TOTAL HYSTERECTOMY (N/A) BILATERAL SALPINGECTOMY (Bilateral) CYSTOSCOPY  Patient Location: PACU  Anesthesia Type:General  Level of Consciousness: awake, alert  and oriented  Airway and Oxygen Therapy: Patient Spontanous Breathing  Post-op Pain: none  Post-op Assessment: Post-op Vital signs reviewed, Patient's Cardiovascular Status Stable, Respiratory Function Stable, Patent Airway, No signs of Nausea or vomiting and Pain level controlled  Post-op Vital Signs: Reviewed and stable  Complications: No apparent anesthesia complications

## 2013-07-30 NOTE — Anesthesia Procedure Notes (Addendum)
Performed by: [employer]   Procedure Name: Intubation Date/Time: 07/30/2013 1:17 PM Performed by: Bartley Vuolo, Jannet Askew Pre-anesthesia Checklist: Patient identified, Emergency Drugs available, Suction available, Patient being monitored and Timeout performed Patient Re-evaluated:Patient Re-evaluated prior to inductionOxygen Delivery Method: Circle system utilized Preoxygenation: Pre-oxygenation with 100% oxygen Intubation Type: IV induction Ventilation: Mask ventilation without difficulty Laryngoscope Size: Mac and 4 Grade View: Grade I Tube size: 7.0 mm Number of attempts: 1 Placement Confirmation: ETT inserted through vocal cords under direct vision,  positive ETCO2,  CO2 detector and breath sounds checked- equal and bilateral Secured at: 22 cm Dental Injury: Teeth and Oropharynx as per pre-operative assessment

## 2013-07-30 NOTE — Progress Notes (Signed)
Called to PACU to set up home CPAP unit for patient during her recovery period.  Assembled unit that had been pre checked by facilities management, plugged in and placed on patient.  Patient stated that the mask and flow were appropriate.  Sats were 99% with a 5 lpm Benedict and ETCO2 detector.  Pt tolerating well.

## 2013-07-30 NOTE — Transfer of Care (Signed)
Immediate Anesthesia Transfer of Care Note  Patient: Stacy Castro  Procedure(s) Performed: Procedure(s): ROBOTIC ASSISTED TOTAL HYSTERECTOMY (N/A) BILATERAL SALPINGECTOMY (Bilateral) CYSTOSCOPY  Patient Location: PACU  Anesthesia Type:General  Level of Consciousness: awake, alert  and oriented  Airway & Oxygen Therapy: Patient Spontanous Breathing and Patient connected to face mask oxygen  Post-op Assessment: Report given to PACU RN and Post -op Vital signs reviewed and stable  Post vital signs: stable . Complications: No apparent anesthesia complications. Patient complaining of right arm bicep and shoulder pain. Said she had in right arm preoperatively from cleaning carpet. Drs. Rodman Pickle and [employer] To give 30 of toradol, narcotic as needed and heat to shoulder.

## 2013-07-30 NOTE — Progress Notes (Signed)
Respiratory at bedside setting up patient's CPAP, which has been approved by facilities for use in hospital.  Will transport patient to third floor with CPAP  And instruct receiving RN to contact respiratory for questions. Patient instructed to use CPAP at all times while in the hospital and at home while on Narcotics for pain.  Patient teaching done by Quay Burow, RN and observed by this RN.  Stephanie Coup, RN 07/30/2013  19:30

## 2013-07-30 NOTE — Progress Notes (Signed)
RT called to patients room concerning her home CPAP machine. Pt claims previous RT changed/messed up her settings. RT told pt that the settings are preset by home health care and we arent able to change the settings. Pt claims it isnt working right but was very pleasant about it. RT talked her through the ramp setting that pressure started out low and would increase in a few mins. RT will monitor.

## 2013-07-31 ENCOUNTER — Encounter (HOSPITAL_COMMUNITY): Payer: Self-pay | Admitting: *Deleted

## 2013-07-31 LAB — BASIC METABOLIC PANEL
GFR calc Af Amer: >90 mL/min (ref >90)
GFR calc non Af Amer: >90 mL/min (ref >90)
Glucose, Bld: 127 mg/dL — ABNORMAL HIGH (ref 70–99)
Potassium: 4.5 mEq/L (ref 3.5–5.1)
Sodium: 135 mEq/L (ref 135–145)

## 2013-07-31 LAB — CBC
HCT: 30.8 % — ABNORMAL LOW (ref 36.0–46.0)
Hemoglobin: 10.3 g/dL — ABNORMAL LOW (ref 12.0–15.0)
RBC: 3.6 MIL/uL — ABNORMAL LOW (ref 3.87–5.11)

## 2013-07-31 MED ORDER — IBUPROFEN 800 MG PO TABS: 800.0000 mg | ORAL_TABLET | Freq: Three times a day (TID) | ORAL | Status: AC | PRN

## 2013-07-31 MED ORDER — OXYCODONE-ACETAMINOPHEN 5-325 MG PO TABS: 1.0000 | ORAL_TABLET | ORAL | Status: AC | PRN

## 2013-07-31 MED ORDER — SIMETHICONE 80 MG PO CHEW
160.0000 mg | CHEWABLE_TABLET | Freq: Once | ORAL | Status: AC
  Administered 2013-07-31: 160 mg via ORAL
  Filled 2013-07-31: qty 2

## 2013-07-31 NOTE — Progress Notes (Signed)
Pt discharged to home with daughter, sister, and mother.  Condition stable.  Pt ambulated to car with RN.  No equipment for home ordered at discharge.

## 2013-07-31 NOTE — Discharge Summary (Signed)
Physician Discharge Summary  Patient ID: Stacy Castro MRN: [id number] DOB/AGE: [birth year] 47 y.o.  Admit date: 07/30/2013 Discharge date: 07/31/2013  Admission Diagnoses: menorrhagia, dysmenorrhea, uterine fibroids, morbid obesity  Discharge Diagnoses: same Active Problems:   * No active hospital problems. *   Discharged Condition: stable  Hospital Course: pt was admitted to [facility]. She underwent daVinci robotic total hsyterectomy, bilateral salpingectomy, cystoscopy. Pt had unremarkable postoperative course  Consults: None  Significant Diagnostic Studies: labs:  CBC    Component Value Date/Time   WBC 14.1* 07/31/2013 0500   RBC 3.60* 07/31/2013 0500   HGB 10.3* 07/31/2013 0500   HCT 30.8* 07/31/2013 0500   PLT 194 07/31/2013 0500   MCV 85.6 07/31/2013 0500   MCH 28.6 07/31/2013 0500   MCHC 33.4 07/31/2013 0500   RDW 13.6 07/31/2013 0500   LYMPHSABS 3.5 02/22/2012 2257   MONOABS 0.7 02/22/2012 2257   EOSABS 0.1 02/22/2012 2257   BASOSABS 0.0 02/22/2012 2257    BMET    Component Value Date/Time   NA 135 07/31/2013 0500   K 4.5 07/31/2013 0500   CL 101 07/31/2013 0500   CO2 27 07/31/2013 0500   GLUCOSE 127* 07/31/2013 0500   BUN 11 07/31/2013 0500   CREATININE 0.74 07/31/2013 0500   CALCIUM 8.9 07/31/2013 0500   GFRNONAA >90 07/31/2013 0500   GFRAA >90 07/31/2013 0500      Treatments: surgery: DaVinci robotic total hysterectomy, bilateral salpingectomy, cystoscopy  Discharge Exam: Blood pressure 101/42, pulse 98, temperature 98.1 F (36.7 C), temperature source Oral, resp. rate 18, SpO2 98.00%. General appearance: alert, cooperative and no distress Resp: clear to auscultation bilaterally Cardio: regular rate and rhythm, S1, S2 normal, no murmur, click, rub or gallop GI: soft, non-tender; bowel sounds normal; no masses,  no organomegaly Pelvic: deferred Extremities: no edema, redness or tenderness in the calves or thighs Incision/Wound:well approximated.  Dry/clean/intact  Disposition: 01-Home or Self Care  Discharge Orders   Future Orders Complete By Expires   Diet general  As directed    Discharge patient  As directed    Increase activity slowly  As directed    No wound care  As directed        Medication List    STOP taking these medications       naproxen sodium 220 MG tablet  Commonly known as:  ANAPROX      TAKE these medications       ibuprofen 800 MG tablet  Commonly known as:  ADVIL,MOTRIN  Take 1 tablet (800 mg total) by mouth every 8 (eight) hours as needed (mild pain).     oxyCODONE-acetaminophen 5-325 MG per tablet  Commonly known as:  PERCOCET/ROXICET  Take 1-2 tablets by mouth every 4 (four) hours as needed.           Follow-up Information   Follow up with Hugh Garrow A, MD In 2 weeks.   Specialty:  Obstetrics and Gynecology   Contact information:   [address] [place] [address] [phone]       Signed: Maxie Better A 07/31/2013, 7:39 AM

## 2013-07-31 NOTE — Progress Notes (Signed)
Subjective: Patient reports tolerating PO and no problems voiding.   Notes some ruq pain. Shoulder pain better Objective: I have reviewed patient's vital signs.  vital signs, intake and output and labs. Filed Vitals:   07/31/13 0530  BP: 101/42  Pulse: 98  Temp: 98.1 F (36.7 C)  Resp: 18   I/O last 3 completed shifts: In: 3550 [I.V.:3550] Out: 2400 [Urine:1900; Blood:500]    Lab Results  Component Value Date   WBC 14.1* 07/31/2013   HGB 10.3* 07/31/2013   HCT 30.8* 07/31/2013   MCV 85.6 07/31/2013   PLT 194 07/31/2013   Lab Results  Component Value Date   CREATININE 0.74 07/31/2013    EXAM General: alert, cooperative and no distress Resp: clear to auscultation bilaterally Cardio: regular rate and rhythm, S1, S2 normal, no murmur, click, rub or gallop GI: soft, non-tender; bowel sounds normal; no masses,  no organomegaly and incision: clean, dry and intact Extremities: no edema, redness or tenderness in the calves or thighs Vaginal Bleeding: minimal  Assessment: s/p Procedure(s):DaVinci ROBOTIC ASSISTED TOTAL HYSTERECTOMY BILATERAL SALPINGECTOMY CYSTOSCOPY: stable, progressing well, tolerating diet and anemia Suspect gas related pain Plan: Advance diet Encourage ambulation Discontinue IV fluids Discharge home f/u 2weeks. heat to RUQ  LOS: 1 day    Leelyn Jasinski A, MD 07/31/2013 7:33 AM    07/31/2013, 7:33 AM

## 2013-08-01 NOTE — Op Note (Signed)
NAMEALYSIAH, SUPPA NO.:  [id number]  MEDICAL RECORD NO.:  [id number]  LOCATION:  9309                          FACILITY:  [facility]  PHYSICIAN:  Maxie Better, M.D.DATE OF BIRTH:  01-08-1966  DATE OF PROCEDURE:  07/30/2013 DATE OF DISCHARGE:  07/31/2013                              OPERATIVE REPORT   PREOPERATIVE DIAGNOSES:  Menorrhagia, uterine fibroids, morbid obesity.  PROCEDURES:  Da Vinci robotic-assisted total hysterectomy, bilateral salpingectomy, cystoscopy.  POSTOPERATIVE DIAGNOSES:  Menorrhagia, uterine fibroids, morbid obesity.  ANESTHESIA:  General.  SURGEON:  Maxie Better, M.D.  ASSISTANTS:  Marlinda Mike, C.N.M. and Denton Meek, C.N.M.  DESCRIPTION OF PROCEDURE:  Under adequate general anesthesia, the patient was placed in a dorsal lithotomy position.  She was sterilely prepped and draped in usual fashion.  The bladder had a 3-way Foley sterilely inserted.  Examination under anesthesia had revealed the uterus that was about 14-week size limited by the patient's body habitus.  Nonetheless, the patient was sterilely prepped and draped in usual fashion.  A bivalve speculum was placed in vagina.  There was old cervical laceration noted at 7 o'clock position.  A 0 Vicryl figure-of- eight suture was placed in the anterior and posterior lip of the cervix. The cervix was then dilated and the uterus sounded to 9 cm.  The cervix was then further dilated.  A medium size KOH RUMI cup attached and a #10 uterine manipulator was introduced into the uterine cavity without incident.  The weighted instruments were then removed.  Attention was then turned to the abdomen.  A supraumbilical incision was then made after 0.25% Marcaine was injected.  A Veress needle was introduced. Carbon dioxide was insufflated. A 12-mm disposable trocar with sleeve was introduced in the abdomen without incident.  The robotic camera was then placed to the port.   The patient was subsequently placed in deep Trendelenburg position.  It was noted that she has a fibroid uterus, prominent sidewall vessels, normal liver edge and gallbladder.  Additional port sites were placed in the left side, 8-mm ports and an assistant port of 5 mm in the right lower quadrant.  The robot was then docked and in arm #1 the monopolar scissors, arm #2 PK dissector, and arm #3 Prograsp.  I then went to the surgical console.  At the surgical console, the pelvis was further inspected and no evidence of endometriosis seen.  The left ovary was attached to the left ovarian fossa.  Both ureters could be seen peristalsing.  Both tubes had evidence of surgical interruption in the past and then there was the large fibroid that was present.  The procedure was started on the left side, where the left retroperitoneal space was opened.  The ureter was again seen.  The underlying mesosalpinx was then serially clamped, cauterized, and then cut and this appeared on the contralateral side as well.  Her additional port sites was not needed.  Once the docking has occurred and the robotic instruments placed at the appropriate locations, I then went to the surgical console.  At the surgical console, the procedure was started on the left side.  The retroperitoneal space on the  left was opened.  The left fallopian tube was removed with serial clamping and cutting of the mesosalpinx.  The left utero-ovarian ligament was then clamped, cauterized, and then cut, followed by the round ligaments. Uterine vessels were then skeletonized.  The anterior leaf of the broad ligament was opened transversely.  The bladder was then sharply dissected off the lower uterine segment, displaced inferiorly. Cauterization of the left uterine vessel was then occurred and the procedure was performed in similar fashion on the contralateral side having identified the ureter initially.  Once the bladder was down and the  uterine vessels were secured on both sides, the cervicovaginal junction was reached and a circumferential incision was then made and the uterus detached from the vagina.  Changing out the instruments and using the permanent cautery hook, the specimen was brought up further into the vagina. At the top of the vagina, the fibroid uterus which was too large to fit through the vaginal cuff. Using the hook,the fibroid partially enucleated and morcellation done and counter traction subsequently delivering off the uterus through the vagina and the fallopian tubes both were removed as well.  It was then noted with the re-insufflation of the cavity  that there a tear in the left anterior aspect of the vaginal cuff.  With the instruments being replaced for suturing, the 0 Vicryl was used to suture the vaginal cuff tear without any problems.  The 0 V-Loc x2 was then used to close the vaginal cuff.  There was bleeding that was encountered in the left uterine vessels pedicle after I went to the opposite side which was hemostased with cauterization.  At this point, it was then noted that there was some blood in the urine.  Decision was then made to explore that and the Foley catheter was removed.  A 70-degree cystoscope was inserted and indigo carmine was given intravenously.  It was then noted that was while slight bruising of the Urethra from the foley catheter and removal of the large uterus, there was no evidence of any suggestions of malignancy in the bladder.  Once the bladder cleared, both ureteral jets being seen with blue dye ejecting, attention was then turned to the abdomen and pelvis.  Foley was sterilely placed. The vaginal cuff was irrigated and suctioned.  Good hemostasis noted.  Once all was done, the robot was undocked.  The specimen was uterus with cervix, fallopian tubes x2 sent to Pathology.  The digital examination of the vaginal cuff showed bleeding noted.  Bivalve speculum was placed  in the vagina. Bleeding noted along the vaginal cuff laceration site on the left.  A 0 Vicryl was then used to reinforce the incision.  Once the defect was further hemostased, the abdomen was irrigated and suctioned.  The sutures were removed and the needles were all removed. Estimated blood loss report was 300 mL. Intraoperative Fluid was 2500 ml. Urine output was 400 ml .  The patient tolerated the procedure well, and was transferred to recovery in stable condition.     Maxie Better, M.D.     [place]/MEDQ  D:  07/31/2013  T:  08/01/2013  Job:  [id number]

## 2013-08-16 ENCOUNTER — Encounter (HOSPITAL_COMMUNITY): Payer: Self-pay | Admitting: Obstetrics and Gynecology

## 2013-11-19 IMAGING — CT CT ANGIO CHEST
2 of 6 series · 18 of 36 positions shown · IV contrast (APPLIED)
Comparison: None.

CLINICAL DATA: Chest pain and shortness of breath with inspiration.
Elevated D-dimer.

CT ANGIOGRAPHY CHEST
TECHNIQUE: Multidetector CT imaging of the chest using the
standard protocol during bolus administration of intravenous
contrast. Multiplanar reconstructed images including MIPs were
obtained and reviewed to evaluate the vascular anatomy.
Contrast: 80mL OMNIPAQUE IOHEXOL 350 MG/ML SOLN

[Series 5: pe 1.0 b25f · axial · 0.64mm/px · z∈[-261,-26]mm · 17 of 263 slices shown]
[im 14/263  lung]
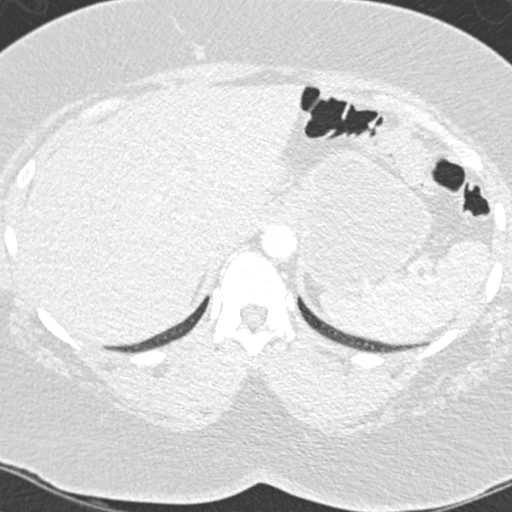
[im 27/263  mediastinal]
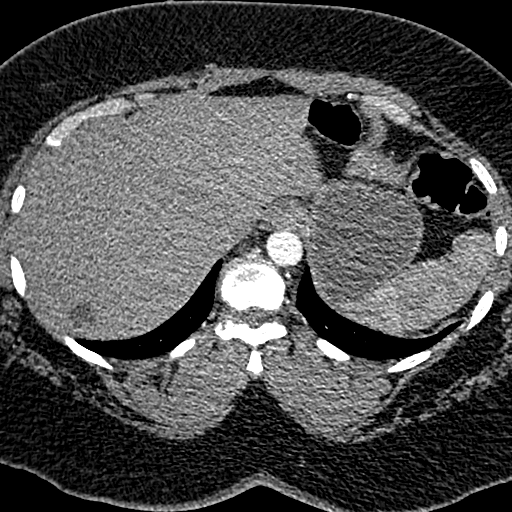
[im 40/263  lung]
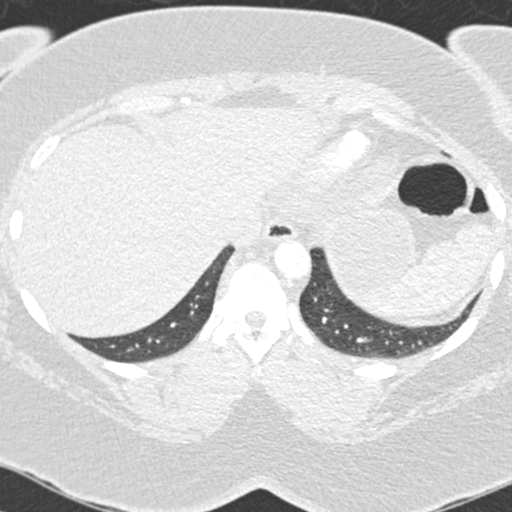
[im 53/263  mediastinal]
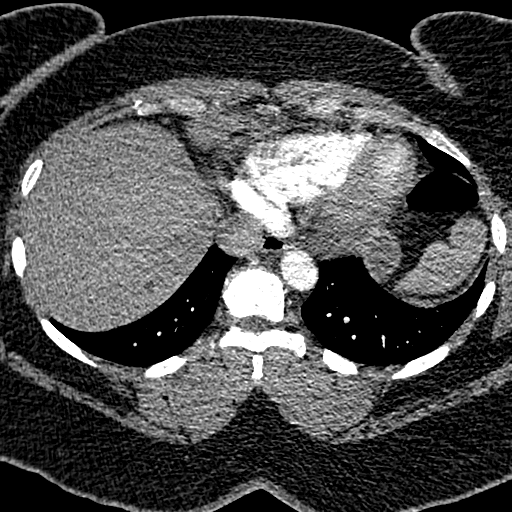
[im 79/263  lung]
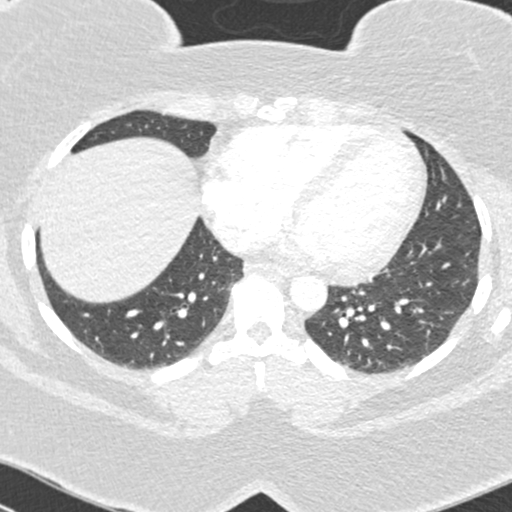
[im 92/263  mediastinal]
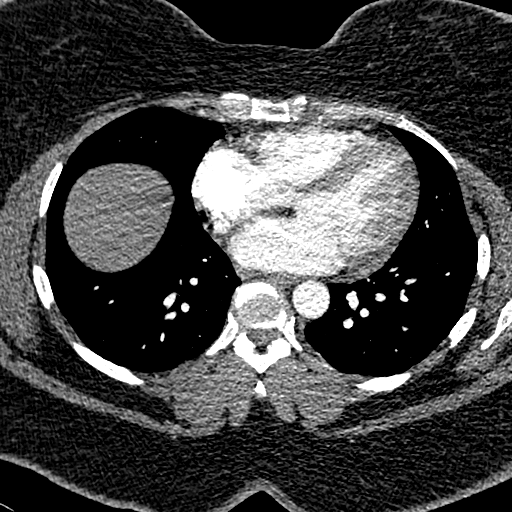
[im 105/263  lung]
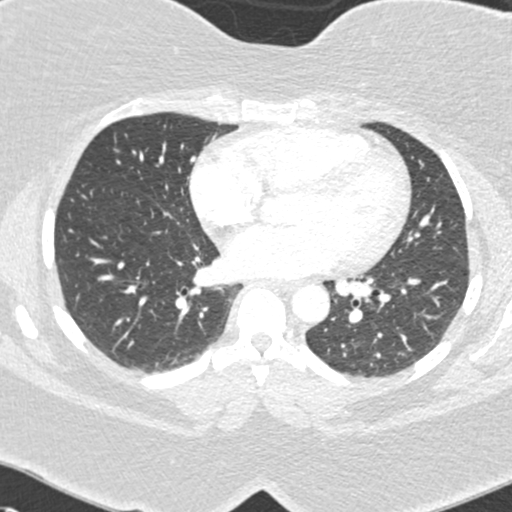
[im 118/263  mediastinal]
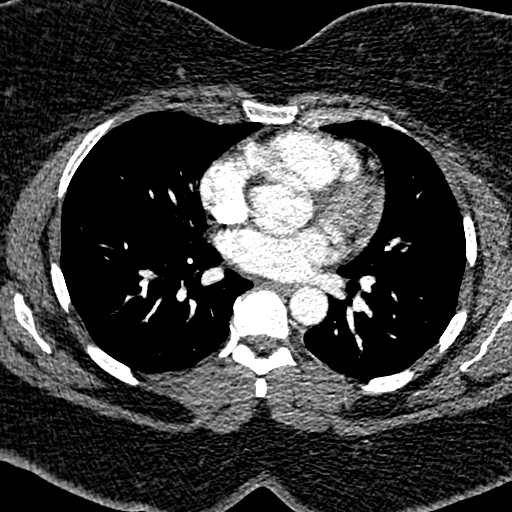
[im 132/263  lung]
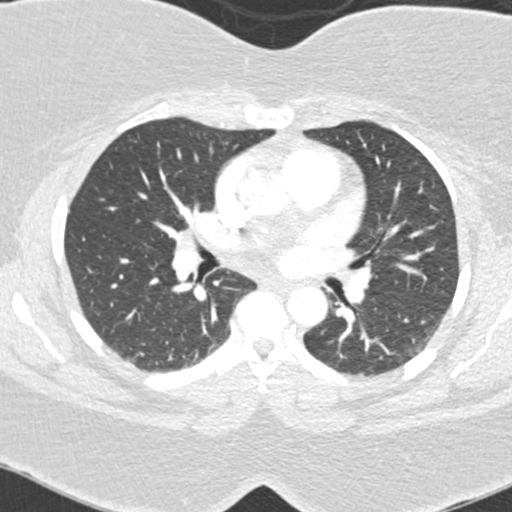
[im 145/263  mediastinal]
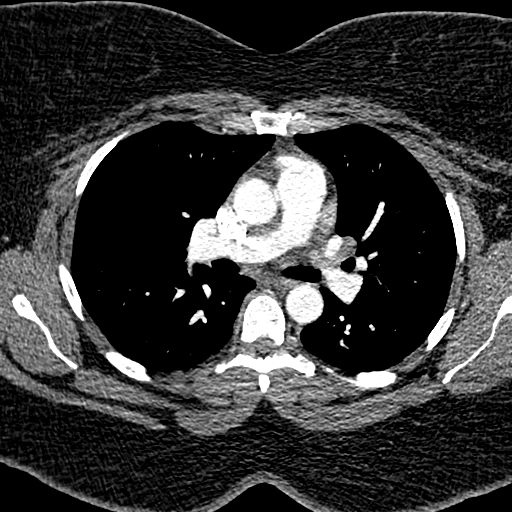
[im 158/263  lung]
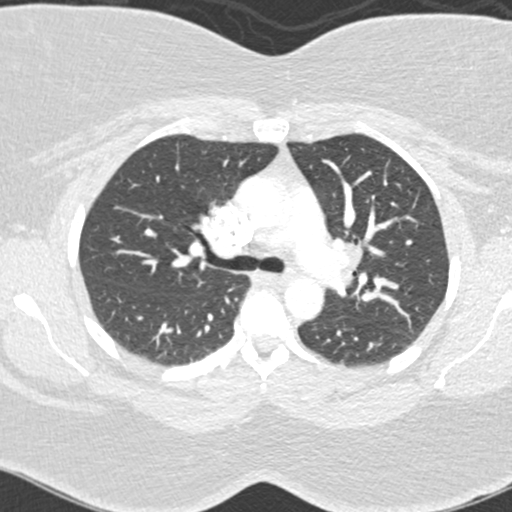
[im 171/263  mediastinal]
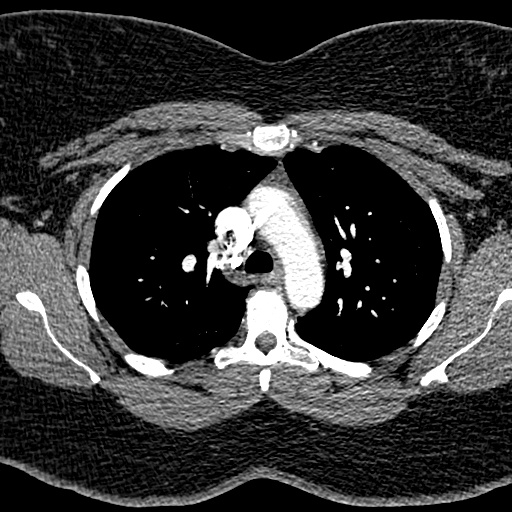
[im 184/263  lung]
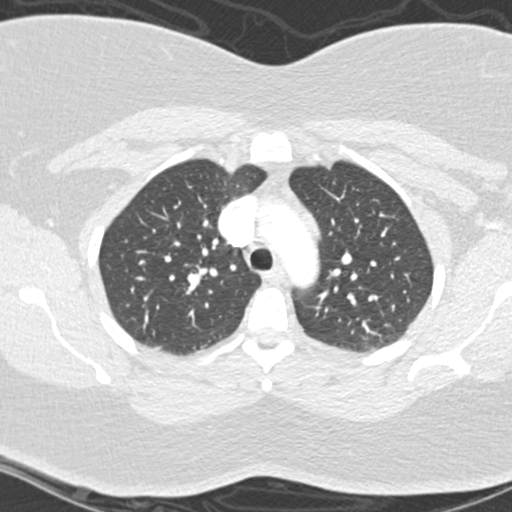
[im 210/263  mediastinal]
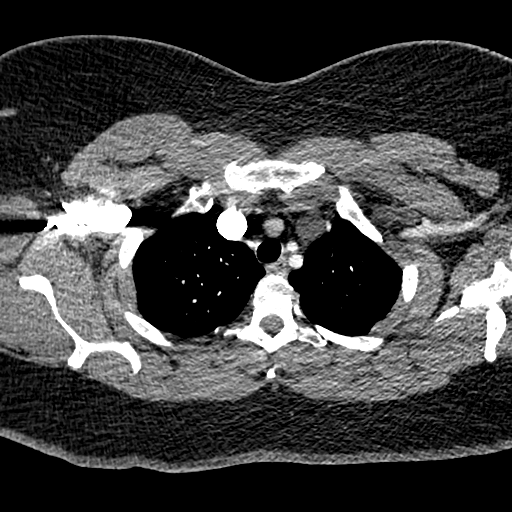
[im 223/263  lung]
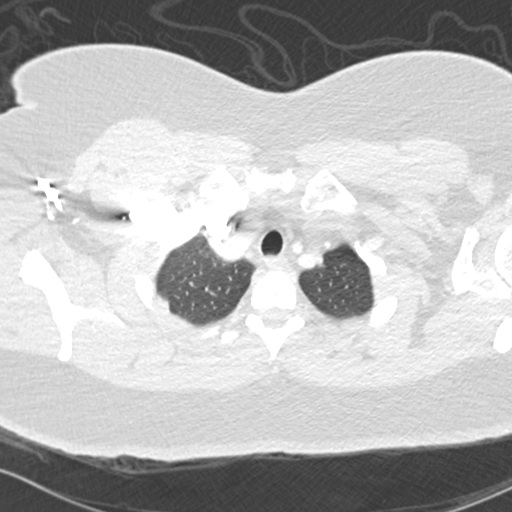
[im 236/263  mediastinal]
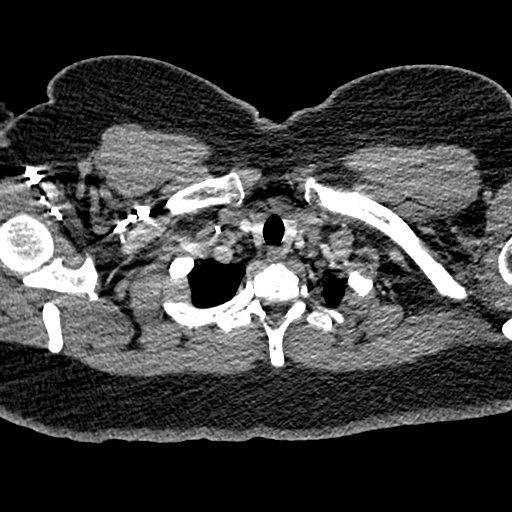
[im 249/263  lung]
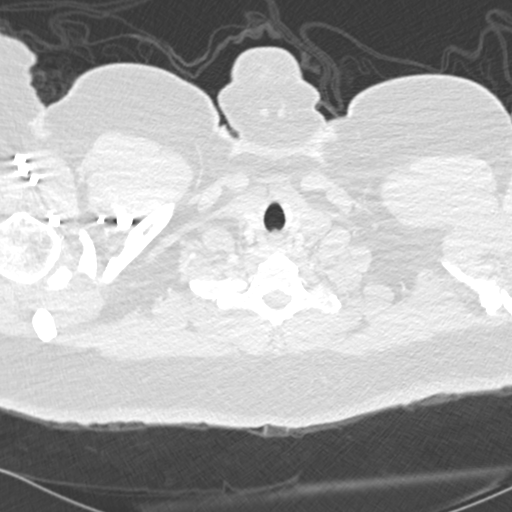

[Series 8: pe 2.0 coronal · coronal · 0.54mm/px · 1 of 102 slices shown]
[im 51/102  mediastinal]
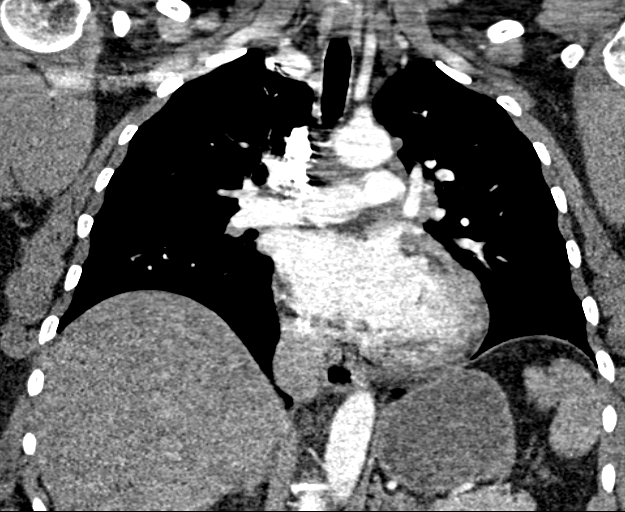

[18 of 36 positions shown; findings below may reference images not displayed]

FINDINGS: Technically adequate study with moderately good
opacification of the central and segmental pulmonary arteries.  No
focal filling defects.  No evidence of significant pulmonary
embolus.

Normal heart size.  Normal caliber thoracic aorta.  No significant
lymphadenopathy in the chest.  Esophagus is decompressed.  No
pleural effusions.  The mild dependent changes in the lung bases.
No significant airspace consolidation or interstitial changes.
Airways appear patent.  No pneumothorax.

Incidental note of two low attenuation lesions in the posterior
right lobe of the liver, measuring 19 mm and 22 mm diameter,
respectively.  These are not well characterized on this study due
to technique.  They may represent cysts, hemangiomas, or focal mass
lesions such as metastasis.  Consider ultrasound or abdominal CT
for further evaluation if there is clinical suspicion of metastatic
disease.

Degenerative changes in the thoracic spine.
IMPRESSION: No evidence of significant pulmonary embolus.  Two low attenuation
lesions demonstrated in the right lobe of the liver which are not
well characterized due to the technique of this study.  Consider
ultrasound or abdominal CT if there is clinical suspicion of
metastatic disease.

## 2014-03-24 ENCOUNTER — Other Ambulatory Visit: Payer: Self-pay | Admitting: Oncology

## 2017-01-03 DIAGNOSIS — E559 Vitamin D deficiency, unspecified: Secondary | ICD-10-CM | POA: Diagnosis not present

## 2017-01-03 DIAGNOSIS — Z0000 Encounter for general adult medical examination without abnormal findings: Secondary | ICD-10-CM | POA: Diagnosis not present

## 2017-01-03 DIAGNOSIS — R739 Hyperglycemia, unspecified: Secondary | ICD-10-CM | POA: Diagnosis not present

## 2017-06-06 DIAGNOSIS — L608 Other nail disorders: Secondary | ICD-10-CM | POA: Diagnosis not present

## 2017-06-06 DIAGNOSIS — B351 Tinea unguium: Secondary | ICD-10-CM | POA: Diagnosis not present

## 2017-06-07 DIAGNOSIS — L03031 Cellulitis of right toe: Secondary | ICD-10-CM | POA: Diagnosis not present

## 2017-06-07 DIAGNOSIS — B351 Tinea unguium: Secondary | ICD-10-CM | POA: Diagnosis not present

## 2017-06-07 DIAGNOSIS — L02611 Cutaneous abscess of right foot: Secondary | ICD-10-CM | POA: Diagnosis not present

## 2017-06-07 DIAGNOSIS — M2041 Other hammer toe(s) (acquired), right foot: Secondary | ICD-10-CM | POA: Diagnosis not present

## 2017-06-20 DIAGNOSIS — L602 Onychogryphosis: Secondary | ICD-10-CM | POA: Diagnosis not present

## 2017-06-20 DIAGNOSIS — L601 Onycholysis: Secondary | ICD-10-CM | POA: Diagnosis not present

## 2017-07-04 DIAGNOSIS — G4733 Obstructive sleep apnea (adult) (pediatric): Secondary | ICD-10-CM | POA: Diagnosis not present

## 2017-07-06 DIAGNOSIS — E559 Vitamin D deficiency, unspecified: Secondary | ICD-10-CM | POA: Diagnosis not present

## 2017-07-06 DIAGNOSIS — E782 Mixed hyperlipidemia: Secondary | ICD-10-CM | POA: Diagnosis not present

## 2017-07-06 DIAGNOSIS — R739 Hyperglycemia, unspecified: Secondary | ICD-10-CM | POA: Diagnosis not present

## 2017-07-11 DIAGNOSIS — G4733 Obstructive sleep apnea (adult) (pediatric): Secondary | ICD-10-CM | POA: Diagnosis not present

## 2018-01-17 ENCOUNTER — Emergency Department (HOSPITAL_BASED_OUTPATIENT_CLINIC_OR_DEPARTMENT_OTHER): Payer: Self-pay

## 2018-01-17 ENCOUNTER — Other Ambulatory Visit: Payer: Self-pay

## 2018-01-17 ENCOUNTER — Emergency Department (HOSPITAL_BASED_OUTPATIENT_CLINIC_OR_DEPARTMENT_OTHER)
Admission: EM | Admit: 2018-01-17 | Discharge: 2018-01-17 | Disposition: A | Discharge status: Home / Self Care | Payer: Self-pay | Attending: Emergency Medicine | Admitting: Emergency Medicine

## 2018-01-17 ENCOUNTER — Encounter (HOSPITAL_BASED_OUTPATIENT_CLINIC_OR_DEPARTMENT_OTHER): Payer: Self-pay | Admitting: *Deleted

## 2018-01-17 DIAGNOSIS — F458 Other somatoform disorders: Secondary | ICD-10-CM | POA: Insufficient documentation

## 2018-01-17 DIAGNOSIS — R0989 Other specified symptoms and signs involving the circulatory and respiratory systems: Secondary | ICD-10-CM

## 2018-01-17 DIAGNOSIS — R09A2 Foreign body sensation, throat: Secondary | ICD-10-CM

## 2018-01-17 DIAGNOSIS — Z79899 Other long term (current) drug therapy: Secondary | ICD-10-CM | POA: Insufficient documentation

## 2018-01-17 MED ORDER — GLUCAGON HCL RDNA (DIAGNOSTIC) 1 MG IJ SOLR: 1.0000 mg | Freq: Once | INTRAMUSCULAR | Status: DC

## 2018-01-17 NOTE — ED Notes (Signed)
Ginger Ale given for po challenge.  Pt is able to drink without any difficulty.

## 2018-01-17 NOTE — ED Provider Notes (Signed)
[facility] EMERGENCY DEPARTMENT Provider Note   CSN: [id number] Arrival date & time: 01/17/18  1227     History   Chief Complaint Chief Complaint  Patient presents with  . Swallowed Foreign Body    HPI Stacy Castro is a 52 y.o. female who presents for evaluation of foreign body in the throat. Patient states that she was eating a chicken wrap that had aluminum foil around it.  Patient reports that she later felt like there was a piece of foil stuck on the back of her right sided tonsils.  Patient reports that she has tried to remove the area herself without any success.  Patient states that she is not having any difficulty tolerating p.o., her secretions.  She states she is not having any vomiting.  She states that the area feels irritated and scratchy.  She denies any difficulty breathing, if occult he swallowing, chest pain.  The history is provided by the patient.    Past Medical History:  Diagnosis Date  . Anemia   . Headache(784.0)    otc med prn  . Sleep apnea    does not use it every night  . SVD (spontaneous vaginal delivery)    x 2    There are no active problems to display for this patient.   Past Surgical History:  Procedure Laterality Date  . ABDOMINAL HYSTERECTOMY    . APPENDECTOMY    . BILATERAL SALPINGECTOMY Bilateral 07/30/2013   Procedure: BILATERAL SALPINGECTOMY;  Surgeon: Marvene Staff, MD;  Location: [facility];  Service: Gynecology;  Laterality: Bilateral;  . CYSTOSCOPY  07/30/2013   Procedure: CYSTOSCOPY;  Surgeon: Marvene Staff, MD;  Location: [facility];  Service: Gynecology;;  . ROBOTIC ASSISTED TOTAL HYSTERECTOMY N/A 07/30/2013   Procedure: ROBOTIC ASSISTED TOTAL HYSTERECTOMY;  Surgeon: Marvene Staff, MD;  Location: [facility];  Service: Gynecology;  Laterality: N/A;  . TUBAL LIGATION       OB History   None      Home Medications    Prior to Admission medications   Medication Sig Start Date End Date Taking? Authorizing  Provider  ibuprofen (ADVIL,MOTRIN) 800 MG tablet Take 1 tablet (800 mg total) by mouth every 8 (eight) hours as needed (mild pain). 07/31/13   Servando Salina, MD  oxyCODONE-acetaminophen (PERCOCET/ROXICET) 5-325 MG per tablet Take 1-2 tablets by mouth every 4 (four) hours as needed. 07/31/13   Servando Salina, MD    Family History No family history on file.  Social History Social History   Tobacco Use  . Smoking status: Never Smoker  . Smokeless tobacco: Never Used  Substance Use Topics  . Alcohol use: No  . Drug use: No     Allergies   Cinnamon and Peanut-containing drug products   Review of Systems Review of Systems  HENT: Positive for sore throat. Negative for drooling and trouble swallowing.      Physical Exam Updated Vital Signs BP 107/83 (BP Location: Right Arm)   Pulse 82   Temp 98.2 F (36.8 C) (Oral)   Resp 16   Ht 5\' 7"  (1.702 m)   Wt (!) 138.3 kg (305 lb)   LMP 07/02/2013   SpO2 98%   BMI 47.77 kg/m   Physical Exam  Constitutional: She appears well-developed and well-nourished.  HENT:  Head: Normocephalic and atraumatic.  Airway is patent, phonation is intact.  Uvula is midline.  Right tonsil has some slight erythematous irritation noted but are symmetric in appearance.  No visualized  foreign body on the tonsils.  No trismus.  No neck or facial swelling.  Eyes: Conjunctivae and EOM are normal. Right eye exhibits no discharge. Left eye exhibits no discharge. No scleral icterus.  Pulmonary/Chest: Effort normal.  No evidence of respiratory distress. Able to speak in full sentences without difficulty. No crepitus to anterior chest wall.   Neurological: She is alert.  Skin: Skin is warm and dry.  Psychiatric: She has a normal mood and affect. Her speech is normal and behavior is normal.  Nursing note and vitals reviewed.    ED Treatments / Results  Labs (all labs ordered are listed, but only abnormal results are displayed) Labs Reviewed -  No data to display  EKG None  Radiology Dg Neck Soft Tissue  Result Date: 01/17/2018 CLINICAL DATA:  RIGHT neck pain after swallowing tinfoil last night. EXAM: NECK SOFT TISSUES - 1+ VIEW COMPARISON:  None. FINDINGS: There is no evidence of retropharyngeal soft tissue swelling or epiglottic enlargement. The cervical airway is unremarkable and no radio-opaque foreign body identified; hair artifact on AP view. Prominent adenoids. Mild cervical spondylosis. IMPRESSION: 1. No radiopaque foreign bodies. 2. Prominent adenoids associated with recent viral illness or immunocompromised states. Electronically Signed   By: Elon Alas M.D.   On: 01/17/2018 14:30    Procedures Procedures (including critical care time)  Medications Ordered in ED Medications - No data to display   Initial Impression / Assessment and Plan / ED Course  I have reviewed the triage vital signs and the nursing notes.  Pertinent labs & imaging results that were available during my care of the patient were reviewed by me and considered in my medical decision making (see chart for details).     52 y.o. F presents for evaluation of foreign body in her right tonsils.  Reports after eating a wrap covered an aluminum foil, she felt like she had a piece of foil stuck in the right-sided tonsil.  Has been able to tolerate secretions, p.o.  No vomiting, difficulty breathing. Patient is afebrile, non-toxic appearing, sitting comfortably on examination table. Vital signs reviewed and stable.  On exam, no visualized foreign body noted.  I thoroughly and extensively examined the patient's posterior oropharynx several times and did not visualize any foreign body.  No crepitus noted on anterior chest.  Low suspicion for foreign body at this time.  Exam is not concerning for esophageal perforation.  I discussed with patient options.  We will plan to get an x-ray of soft tissue neck for evaluation of foreign body.  Given that it is aluminum,  it is radiopaque and should show up on x-ray.  X-ray reviewed.  No identifiable foreign body noted. Discussed results with with patient.  Patient and I discussed treatment options here in the ED.  I offered to do a dose of glucagon here in the ED to see if that helps with any smooth muscle relaxation.  We also discussed the possibility of patient going home and monitoring symptoms.  We discussed at length regarding risk first benefits of not pursuing any further treatment here in the ED.  Patient expresses full understanding and wishes not to pursue any further treatment.  In addition, patient would like to go home and observe symptoms with the instructions to return if she has any worsening concerns.  Patient is able to tolerate p.o. in the department without any difficulty.  She is in no evidence of respiratory distress.  She has no identifiable subcutaneous emphysema on chest  x-ray.  History/physical exam is not concerning for esophageal perforation.  I discussed with patient regarding strict return precautions.  Will provide outpatient GI for patient availability.  Patient instructed return to ED for any worsening or concerning symptoms. Patient had ample opportunity for questions and discussion. All patient's questions were answered with full understanding. Strict return precautions discussed. Patient expresses understanding and agreement to plan.   Final Clinical Impressions(s) / ED Diagnoses   Final diagnoses:  Globus pharyngeus    ED Discharge Orders    None       Desma Mcgregor 01/18/18 0129    Drenda Freeze, MD 01/18/18 [phone]

## 2018-01-17 NOTE — ED Triage Notes (Signed)
She was eating warmed up food that was covered with aluminum foil. She had a sudden sharp pain it her throat. She feels she has a small piece of aluminum foil stuck in her throat.

## 2018-01-17 NOTE — Discharge Instructions (Signed)
As we discussed, you can return to the emergency department for any worsening or concerning symptoms.  I have provided outpatient GI referral that he can use for further follow-up.  Return to the emergency department immediately if you have any difficulty swallowing her secretions, liquids, food, vomiting, neck pain, chest pain, difficulty breathing, neck or facial swelling.

## 2018-01-17 NOTE — ED Notes (Signed)
Patient transported to X-ray 

## 2019-10-02 ENCOUNTER — Emergency Department (HOSPITAL_BASED_OUTPATIENT_CLINIC_OR_DEPARTMENT_OTHER)
Admission: EM | Admit: 2019-10-02 | Discharge: 2019-10-02 | Disposition: A | Discharge status: Home / Self Care | Payer: 59 | Attending: Emergency Medicine | Admitting: Emergency Medicine

## 2019-10-02 ENCOUNTER — Encounter (HOSPITAL_BASED_OUTPATIENT_CLINIC_OR_DEPARTMENT_OTHER): Payer: Self-pay | Admitting: *Deleted

## 2019-10-02 ENCOUNTER — Other Ambulatory Visit: Payer: Self-pay

## 2019-10-02 DIAGNOSIS — M546 Pain in thoracic spine: Secondary | ICD-10-CM | POA: Diagnosis not present

## 2019-10-02 DIAGNOSIS — M549 Dorsalgia, unspecified: Secondary | ICD-10-CM | POA: Diagnosis present

## 2019-10-02 DIAGNOSIS — Z79899 Other long term (current) drug therapy: Secondary | ICD-10-CM | POA: Insufficient documentation

## 2019-10-02 DIAGNOSIS — M545 Low back pain: Secondary | ICD-10-CM | POA: Insufficient documentation

## 2019-10-02 MED ORDER — METHOCARBAMOL 500 MG PO TABS: 1000.0000 mg | ORAL_TABLET | Freq: Three times a day (TID) | ORAL | 0 refills | Status: AC
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
## 2019-10-03 MED FILL — METHOCARBAMOL 500 MG TABS: 500 | 5 days supply | Qty: 30 | Fill #0

## 2020-10-07 ENCOUNTER — Emergency Department (HOSPITAL_BASED_OUTPATIENT_CLINIC_OR_DEPARTMENT_OTHER): Payer: No Typology Code available for payment source

## 2020-10-07 ENCOUNTER — Encounter (HOSPITAL_BASED_OUTPATIENT_CLINIC_OR_DEPARTMENT_OTHER): Payer: Self-pay

## 2020-10-07 ENCOUNTER — Emergency Department (HOSPITAL_BASED_OUTPATIENT_CLINIC_OR_DEPARTMENT_OTHER)
Admission: EM | Admit: 2020-10-07 | Discharge: 2020-10-07 | Disposition: A | Discharge status: Home / Self Care | Payer: No Typology Code available for payment source | Attending: Emergency Medicine | Admitting: Emergency Medicine

## 2020-10-07 ENCOUNTER — Other Ambulatory Visit: Payer: Self-pay

## 2020-10-07 DIAGNOSIS — M25532 Pain in left wrist: Secondary | ICD-10-CM | POA: Diagnosis not present

## 2020-10-07 DIAGNOSIS — R0789 Other chest pain: Secondary | ICD-10-CM | POA: Insufficient documentation

## 2020-10-07 DIAGNOSIS — R5383 Other fatigue: Secondary | ICD-10-CM | POA: Diagnosis not present

## 2020-10-07 DIAGNOSIS — R072 Precordial pain: Secondary | ICD-10-CM | POA: Diagnosis present

## 2020-10-07 DIAGNOSIS — R079 Chest pain, unspecified: Secondary | ICD-10-CM

## 2020-10-07 DIAGNOSIS — Z91010 Allergy to peanuts: Secondary | ICD-10-CM | POA: Insufficient documentation

## 2020-10-07 DIAGNOSIS — M79642 Pain in left hand: Secondary | ICD-10-CM | POA: Diagnosis not present

## 2020-10-07 LAB — CBC
HCT: 40.8 % (ref 36.0–46.0)
Hemoglobin: 13.7 g/dL (ref 12.0–15.0)
MCH: 30 pg (ref 26.0–34.0)
MCHC: 33.6 g/dL (ref 30.0–36.0)
MCV: 89.3 fL (ref 80.0–100.0)
Platelets: 269 10*3/uL (ref 150–400)
RBC: 4.57 MIL/uL (ref 3.87–5.11)
RDW: 12.8 % (ref 11.5–15.5)
WBC: 8.9 10*3/uL (ref 4.0–10.5)
nRBC: 0 % (ref 0.0–0.2)

## 2020-10-07 LAB — BASIC METABOLIC PANEL
Anion gap: 7 (ref 5–15)
BUN: 10 mg/dL (ref 6–20)
CO2: 26 mmol/L (ref 22–32)
Calcium: 9 mg/dL (ref 8.9–10.3)
Chloride: 103 mmol/L (ref 98–111)
Creatinine, Ser: 0.62 mg/dL (ref 0.44–1.00)
GFR, Estimated: >60 mL/min (ref >60)
Glucose, Bld: 105 mg/dL — ABNORMAL HIGH (ref 70–99)
Potassium: 3.8 mmol/L (ref 3.5–5.1)
Sodium: 136 mmol/L (ref 135–145)

## 2020-10-07 LAB — TROPONIN I (HIGH SENSITIVITY)
Troponin I (High Sensitivity): 4 ng/L (ref <18)
Troponin I (High Sensitivity): 3 ng/L (ref <18)

## 2020-10-07 NOTE — Discharge Instructions (Signed)
You have been seen and discharged from the emergency department.  Follow-up with your primary provider and cardiology for reevaluation. Take home medications as prescribed. If you have any worsening symptoms, worsening chest discomfort, severe left upper extremity pain, difficulty breathing or further concerns for health please return to an emergency department for further evaluation.

## 2020-10-07 NOTE — ED Provider Notes (Signed)
[facility] EMERGENCY DEPARTMENT Provider Note   CSN: [id number] Arrival date & time: 10/07/20  [id number]     History Chief Complaint  Patient presents with  . Chest Pain  . Fatigue    Stacy Castro is a 54 y.o. female.  HPI   54 year old female presents the emergency department with concern for midsternal chest discomfort and left upper extremity discomfort. 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 She states last night she started getting pain in her left fingers and hand. This progressed up to her wrist past her elbow to her mid arm. This seemed to be musculoskeletal, movement related, she is had pain like this in the past from all the typing she is done. She took Advil and was able to sleep through the night. When she woke up this morning that pain was gone however now she feels a "soreness" in her midsternal chest. She cannot characterize it as sharp versus heavy versus tight. She states it just feels sore. Denies any shortness of breath, no swelling of her lower extremities. No other acute illness. Right now at rest she is completely asymptomatic.  Past Medical History:  Diagnosis Date  . Anemia   . Headache(784.0)    otc med prn  . Sleep apnea    does not use it every night  . SVD (spontaneous vaginal delivery)    x 2    There are no problems to display for this patient.   Past Surgical History:  Procedure Laterality Date  . ABDOMINAL HYSTERECTOMY    . APPENDECTOMY    . BILATERAL SALPINGECTOMY Bilateral 07/30/2013   Procedure: BILATERAL SALPINGECTOMY;  Surgeon: Marvene Staff, MD;  Location: [facility];  Service: Gynecology;  Laterality: Bilateral;  . CYSTOSCOPY  07/30/2013   Procedure: CYSTOSCOPY;  Surgeon: Marvene Staff, MD;  Location: [facility];  Service: Gynecology;;  . ROBOTIC ASSISTED TOTAL HYSTERECTOMY N/A 07/30/2013   Procedure: ROBOTIC ASSISTED TOTAL HYSTERECTOMY;  Surgeon: Marvene Staff, MD;  Location: [facility];  Service:  Gynecology;  Laterality: N/A;  . TUBAL LIGATION       OB History   No obstetric history on file.     History reviewed. No pertinent family history.  Social History   Tobacco Use  . Smoking status: Never Smoker  . Smokeless tobacco: Never Used  Substance Use Topics  . Alcohol use: No  . Drug use: No    Home Medications Prior to Admission medications   Medication Sig Start Date End Date Taking? Authorizing Provider  ibuprofen (ADVIL,MOTRIN) 800 MG tablet Take 1 tablet (800 mg total) by mouth every 8 (eight) hours as needed (mild pain). 07/31/13   Servando Salina, MD  oxyCODONE-acetaminophen (PERCOCET/ROXICET) 5-325 MG per tablet Take 1-2 tablets by mouth every 4 (four) hours as needed. 07/31/13   Servando Salina, MD    Allergies    Cinnamon and Peanut-containing drug products  Review of Systems   Review of Systems  Constitutional: Negative for chills and fever.  HENT: Negative for congestion.   Eyes: Negative for visual disturbance.  Respiratory: Negative for shortness of breath.   Cardiovascular: Positive for chest pain.  Gastrointestinal: Negative for abdominal pain, diarrhea and vomiting.  Genitourinary: Negative for dysuria.  Musculoskeletal:       Left hand/arm pain  Skin: Negative for rash.  Neurological: Negative for headaches.    Physical Exam Updated Vital Signs BP (!) 132/92 (BP Location: Right Arm)   Pulse 75   Temp 98.4 F (  36.9 C) (Oral)   Resp 18   Ht 5\' 7"  (1.702 m)   Wt (!) 146.6 kg   LMP 07/02/2013   SpO2 96%   BMI 50.60 kg/m   Physical Exam Vitals and nursing note reviewed.  Constitutional:      Appearance: Normal appearance.  HENT:     Head: Normocephalic.     Mouth/Throat:     Mouth: Mucous membranes are moist.  Cardiovascular:     Rate and Rhythm: Normal rate.  Pulmonary:     Effort: Pulmonary effort is normal. No respiratory distress.  Chest:     Chest wall: Crepitus present. No tenderness.  Abdominal:      Palpations: Abdomen is soft.     Tenderness: There is no abdominal tenderness.  Musculoskeletal:     Right lower leg: No edema.     Left lower leg: No edema.     Comments: Equal palpable radial pulses, some slight discomfort with manipulation of the left wrist  Skin:    General: Skin is warm.  Neurological:     Mental Status: She is alert and oriented to person, place, and time. Mental status is at baseline.  Psychiatric:        Mood and Affect: Mood normal.     ED Results / Procedures / Treatments   Labs (all labs ordered are listed, but only abnormal results are displayed) Labs Reviewed  BASIC METABOLIC PANEL - Abnormal; Notable for the following components:      Result Value   Glucose, Bld 105 (*)    All other components within normal limits  CBC  TROPONIN I (HIGH SENSITIVITY)  TROPONIN I (HIGH SENSITIVITY)    EKG EKG Interpretation  Date/Time:  Tuesday October 07 2020 09:02:41 EST Ventricular Rate:  80 PR Interval:  132 QRS Duration: 82 QT Interval:  390 QTC Calculation: 449 R Axis:   36 Text Interpretation: Normal sinus rhythm Normal ECG NSR, no ischemic changes Confirmed by Lavenia Atlas [phone]) on 10/07/2020 11:52:45 AM   Radiology DG Chest Port 1 View  Result Date: 10/07/2020 CLINICAL DATA:  Chest pain and fatigue EXAM: PORTABLE CHEST 1 VIEW COMPARISON:  02/21/2029 FINDINGS: The heart size and mediastinal contours are within normal limits. Both lungs are clear. The visualized skeletal structures are unremarkable. IMPRESSION: No active disease. Electronically Signed   By: Kerby Moors M.D.   On: 10/07/2020 13:29    Procedures Procedures (including critical care time)  Medications Ordered in ED Medications - No data to display  ED Course  I have reviewed the triage vital signs and the nursing notes.  Pertinent labs & imaging results that were available during my care of the patient were reviewed by me and considered in my medical decision making (see  chart for details).    MDM Rules/Calculators/A&P                          54 year old female presents the emergency department with left hand/wrist pain and a midsternal chest soreness. I believe these 2 complaints are separate, the left extremity complaint appears to be musculoskeletal, painful with movement last night, resolved with Advil, not present today. She is also complaining of a midsternal chest soreness, this is also resolved on my exam. Physical exam is unremarkable, blood work is reassuring, troponins are negative with no delta. Chest x-ray shows no acute finding. EKG is normal sinus rhythm with no ischemic changes. She is a heart score of 2  or lower, low suspicion for ACS. She has no shortness of breath, tachycardia, hypoxia, low suspicion for PE. Patient will be discharged and treated as an outpatient.  Discharge plan and strict return to ED precautions discussed, patient verbalizes understanding and agreement.  Final Clinical Impression(s) / ED Diagnoses Final diagnoses:  Atypical chest pain    Rx / DC Orders ED Discharge Orders    None       Rozelle Logan, DO 10/07/20 1515

## 2020-10-07 NOTE — ED Triage Notes (Signed)
Pt reports pressure in her chest for the last 2 weeks that comes and goes. Pt states she has a hx or irregular heart heart beat. Pt also reports fatigue x2 weeks. Pt states yesterday she had pain in her L hand that radiates up her arm.

## 2022-06-26 LAB — GLUCOSE, POCT (MANUAL RESULT ENTRY): POC Glucose: 98 mg/dl (ref 70–99)

## 2022-07-04 IMAGING — DX DG CHEST 1V PORT
1 series · 1 of 1 positions shown · non-contrast
Comparison: 02/21/2029

CLINICAL DATA: Chest pain and fatigue

EXAM:
PORTABLE CHEST 1 VIEW

[chest ap]
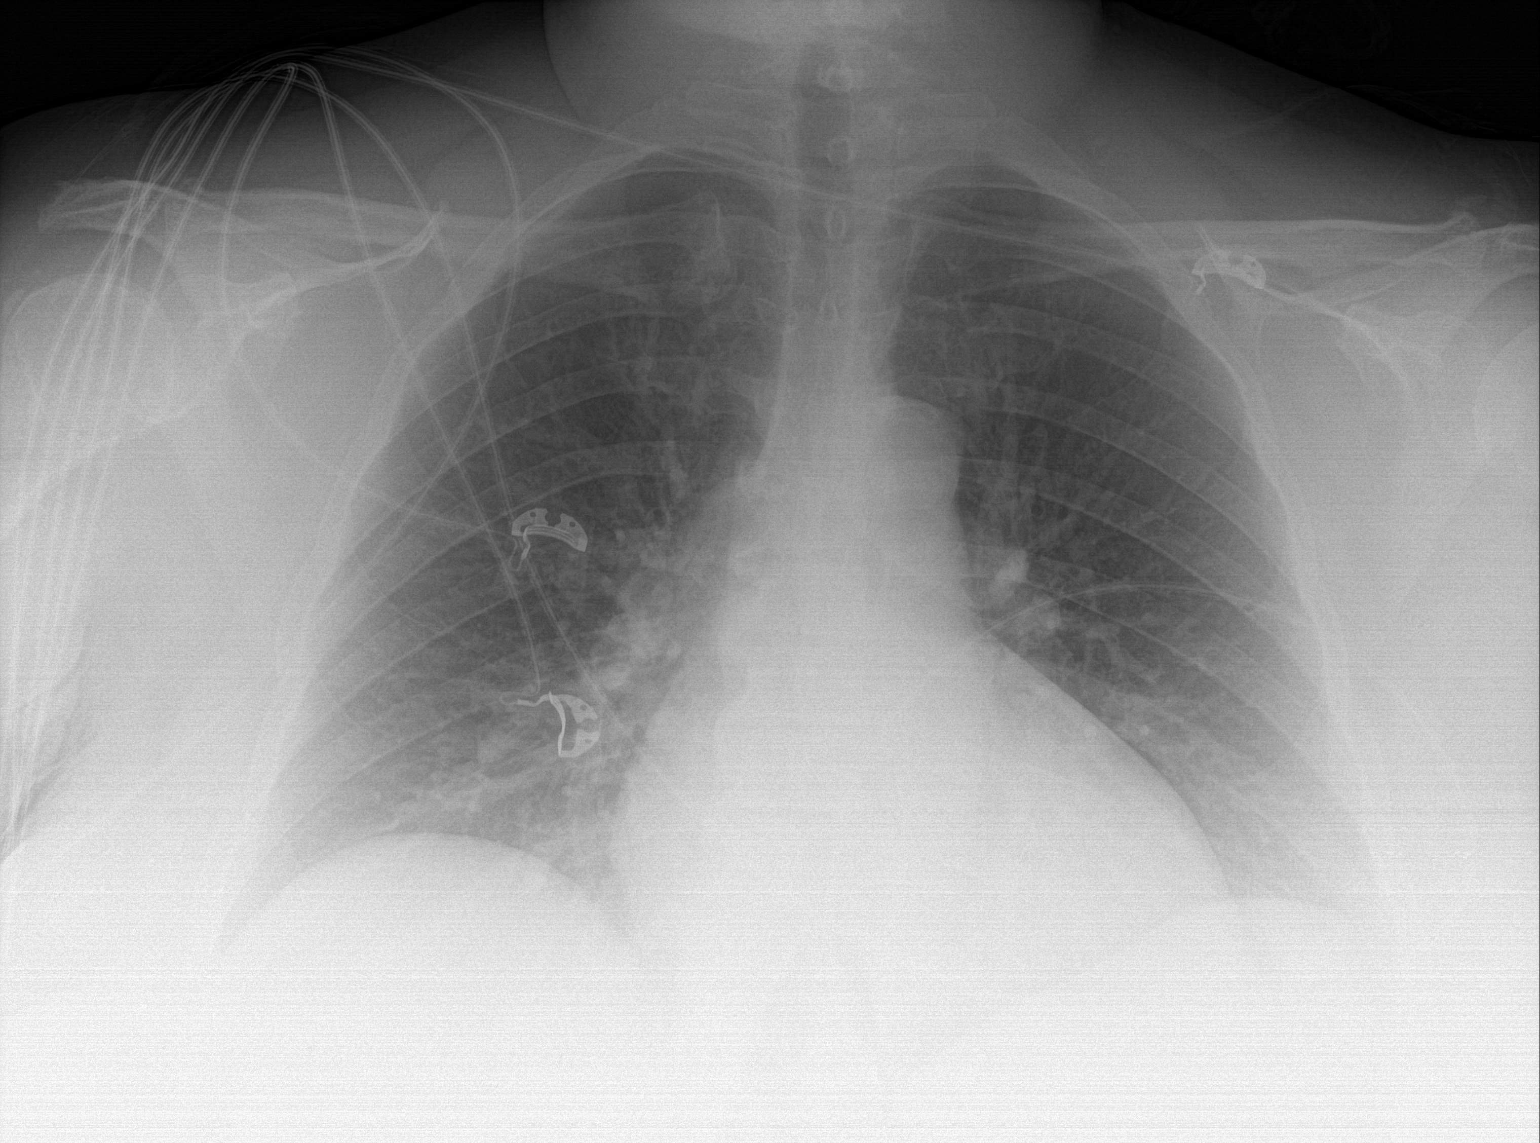

[1 of 1 positions shown; findings below may reference images not displayed]

FINDINGS: The heart size and mediastinal contours are within normal limits.
Both lungs are clear. The visualized skeletal structures are
unremarkable.
IMPRESSION: No active disease.
